# Patient Record
Sex: Male | Born: 1955
Health system: Southern US, Community
[De-identification: ages and names within clinical notes are randomized; demographics above are authoritative.]

## PROBLEM LIST (undated history)

## (undated) DIAGNOSIS — I739 Peripheral vascular disease, unspecified: Secondary | ICD-10-CM

## (undated) DIAGNOSIS — E039 Hypothyroidism, unspecified: Secondary | ICD-10-CM

## (undated) DIAGNOSIS — J309 Allergic rhinitis, unspecified: Secondary | ICD-10-CM

## (undated) DIAGNOSIS — M1991 Primary osteoarthritis, unspecified site: Secondary | ICD-10-CM

## (undated) DIAGNOSIS — J300 Vasomotor rhinitis: Secondary | ICD-10-CM

## (undated) DIAGNOSIS — F419 Anxiety disorder, unspecified: Secondary | ICD-10-CM

## (undated) HISTORY — PX: VASECTOMY: SHX75

## (undated) HISTORY — DX: Anxiety disorder, unspecified: F41.9

## (undated) HISTORY — DX: Primary osteoarthritis, unspecified site: M19.91

## (undated) HISTORY — DX: Hypothyroidism, unspecified: E03.9

## (undated) HISTORY — PX: TONSILLECTOMY: SUR1361

## (undated) HISTORY — DX: Allergic rhinitis, unspecified: J30.9

## (undated) HISTORY — DX: Peripheral vascular disease, unspecified: I73.9

## (undated) HISTORY — DX: Vasomotor rhinitis: J30.0

---

## 1997-06-05 ENCOUNTER — Ambulatory Visit (HOSPITAL_COMMUNITY): Admission: RE | Admit: 1997-06-05 | Discharge: 1997-06-05 | Payer: Self-pay | Admitting: Cardiology

## 1998-07-10 ENCOUNTER — Emergency Department (HOSPITAL_COMMUNITY): Admission: EM | Admit: 1998-07-10 | Discharge: 1998-07-10 | Payer: Self-pay | Admitting: Emergency Medicine

## 2004-01-19 ENCOUNTER — Encounter: Admission: RE | Admit: 2004-01-19 | Discharge: 2004-01-19 | Payer: Self-pay | Admitting: Family Medicine

## 2013-03-13 ENCOUNTER — Encounter (HOSPITAL_COMMUNITY): Payer: Self-pay | Admitting: Emergency Medicine

## 2013-03-13 ENCOUNTER — Emergency Department (HOSPITAL_COMMUNITY)
Admission: EM | Admit: 2013-03-13 | Discharge: 2013-03-13 | Disposition: A | Discharge status: Home / Self Care | Payer: BC Managed Care – PPO | Attending: Emergency Medicine | Admitting: Emergency Medicine

## 2013-03-13 DIAGNOSIS — Z87891 Personal history of nicotine dependence: Secondary | ICD-10-CM | POA: Insufficient documentation

## 2013-03-13 DIAGNOSIS — R230 Cyanosis: Secondary | ICD-10-CM

## 2013-03-13 DIAGNOSIS — Z79899 Other long term (current) drug therapy: Secondary | ICD-10-CM | POA: Insufficient documentation

## 2013-03-13 NOTE — Discharge Instructions (Signed)
If you have pain in your hand, worsening color changes or numbness, return to the ED. Someone from Dr. Candie ChromanLawson's office will call you for an appointment in a few days, if you do not hear from them, call on Friday.   Raynaud's Syndrome Raynaud's Syndrome is a disorder of the blood vessels in your hands and feet. It occurs when small arteries of the arms/hands or legs/feet become sensitive to cold . This causes the arteries to constrict, or narrow, and reduces blood flow to the area. The color in the fingers or toes changes from white to bluish to red and this is not usually painful. There may be numbness and tingling. Sores on the skin (ulcers) can form. Symptoms are usually relieved by warming. HOME CARE INSTRUCTIONS   Avoid exposure to cold. Keep your whole body warm and dry. Dress in layers. Wear mittens or gloves when handling ice or frozen food and when outdoors. Use holders for glasses or cans containing cold drinks. If possible, stay indoors during cold weather.  Limit your use of caffeine. Switch to decaffeinated coffee, tea, and soda pop. Avoid chocolate.  Avoid smoking or being around cigarette smoke. Smoke will make symptoms worse.  Wear loose fitting socks and comfortable, roomy shoes.  Avoid vibrating tools and machinery.  If possible, avoid stressful and emotional situations. Exercise, meditation and yoga may help you cope with stress. Biofeedback may be useful.  Ask your caregiver about medicine (calcium channel blockers) that may control Raynaud's phenomena. SEEK MEDICAL CARE IF:   Your discomfort becomes worse, despite conservative treatment.  You develop sores on your fingers and toes that do not heal. Document Released: 01/29/2000 Document Revised: 04/25/2011 Document Reviewed: 02/05/2008 [facility] Patient Information 2014 [employer] [place].

## 2013-03-13 NOTE — ED Provider Notes (Signed)
CSN: [id number]     Arrival date & time 03/13/13  1423 History   First MD Initiated Contact with Patient 03/13/13 1922     Chief Complaint  Patient presents with  . Poor Circulation    HPI: [name] is a 58 yo M with no pertinent history who presents with numbness and cyanosis of left 4th and 5th digits. Symptoms started three days ago and has been persistent since that time. Symptoms are worse with exposure to cold weather. He also endorses paresthesias to these digits. He was seen by his PCP today who recommended ED evaluation for possible vascular occlusion. Otherwise he is asymptomatic, denies pain to his hand or arm. No chest pain, SOB, diarrhea, vomiting or diarrhea. He has not had symptoms similar to this in the past. No history of PVD, CAD or CVA. Remote smoking history. Family history of CAD.    History reviewed. No pertinent past medical history. Past Surgical History  Procedure Laterality Date  . Tonsillectomy    . Vasectomy     No family history on file. History  Substance Use Topics  . Smoking status: Former Smoker    Quit date: 03/13/1998  . Smokeless tobacco: Former NeurosurgeonUser    Types: Chew    Quit date: 08/11/2012  . Alcohol Use: No    Review of Systems  Constitutional: Negative for fever, chills, appetite change and fatigue.  Eyes: Negative for photophobia and visual disturbance.  Respiratory: Negative for cough and shortness of breath.   Cardiovascular: Negative for chest pain and leg swelling.  Gastrointestinal: Negative for nausea, vomiting, abdominal pain, diarrhea and constipation.  Genitourinary: Negative for dysuria, frequency and decreased urine volume.  Musculoskeletal: Negative for arthralgias, back pain, gait problem and myalgias.  Skin: Positive for color change (left 4-5th digits. ). Negative for wound.  Neurological: Negative for dizziness, syncope, light-headedness and headaches.       + paraesthesias left hand .   Psychiatric/Behavioral: Negative  for confusion and agitation.  All other systems reviewed and are negative.    Allergies  Review of patient's allergies indicates no known allergies.  Home Medications   Current Outpatient Rx  Name  Route  Sig  Dispense  Refill  . Cholecalciferol (VITAMIN D) 2000 UNITS tablet   Oral   Take 4,000 Units by mouth daily.         . citalopram (CELEXA) 40 MG tablet   Oral   Take 40 mg by mouth daily.         . cyclobenzaprine (FLEXERIL) 10 MG tablet   Oral   Take 10 mg by mouth at bedtime.         Marland Kitchen. ipratropium (ATROVENT) 0.06 % nasal spray   Each Nare   Place 2 sprays into both nostrils 3 (three) times daily.         . Multiple Vitamin (MULTIVITAMIN WITH MINERALS) TABS tablet   Oral   Take 1 tablet by mouth daily.          BP 159/84  Pulse 103  Temp(Src) 98.2 F (36.8 C) (Oral)  Resp 20  Ht 6' 2.25" (1.886 m)  Wt 264 lb 2 oz (119.806 kg)  BMI 33.68 kg/m2  SpO2 97% Physical Exam  Nursing note and vitals reviewed. Constitutional: He is oriented to person, place, and time. He appears well-developed and well-nourished. No distress.  HENT:  Head: Normocephalic and atraumatic.  Mouth/Throat: Oropharynx is clear and moist.  Eyes: Conjunctivae and EOM are normal. Pupils are  equal, round, and reactive to light.  Neck: Normal range of motion. Neck supple.  Cardiovascular: Normal rate, regular rhythm, normal heart sounds and intact distal pulses.   2+ L radial and ulnar pulses.   Pulmonary/Chest: Effort normal and breath sounds normal. No respiratory distress.  Abdominal: Soft. Bowel sounds are normal. There is no tenderness. There is no rebound and no guarding.  Musculoskeletal: Normal range of motion. He exhibits no edema and no tenderness.  Left 4-5th digit cyanosis, normal capillary refill. Diminished sensation to same digits.   Neurological: He is alert and oriented to person, place, and time. No cranial nerve deficit. Coordination normal.  Skin: Skin is warm  and dry. No rash noted.  Psychiatric: He has a normal mood and affect. His behavior is normal.    ED Course  Procedures (including critical care time) Labs Review Labs Reviewed - No data to display Imaging Review No results found.  EKG Interpretation   None       MDM   58 yo F presents with cyanosis to left fourth and fifth digits with associated numbness. No pain except when exposed to cold air, but this resolves once warm. No pain in his hand or arm now. Normal radial and ulnar pulses. Doubt acute vascular occlusion given normal pulses, only mild nailbed cyanosis, no pain currently. Bilateral blood pressures symmetric. I spoke to Dr. Hart Rochester who feels this is likely a peripheral problem like Raynaud's. In any event, he would like to see the patient in clinic in the next 5-7 days. He does not feel that emergent work up needs to be done tonight. I spoke to the patient about his likely diagnosis and that Dr. Hart Rochester will see him in clinic. He was given return precautions. Patient and his wife were in agreement with plan.    Discussed case with Dr. Blinda Leatherwood.   Clinical Impression 1. Finger cyanosis on left hand  Margie Billet, MD 03/14/13 [phone]

## 2013-03-13 NOTE — ED Notes (Signed)
MD at bedside. 

## 2013-03-13 NOTE — ED Notes (Addendum)
Pt states that since Monday his middle, ring, and pinkie fingers of the L hand have been cold and turning blue.  [facility] and they sent him here for further eval.  Pt states last winter his ring finger of his L hand had the same problem.  Pt's pinkie finger has a blue tint at this time. Unable to obtain SPO2 in pinkie or ring fingers.

## 2013-03-14 ENCOUNTER — Telehealth: Payer: Self-pay | Admitting: Vascular Surgery

## 2013-03-14 ENCOUNTER — Other Ambulatory Visit: Payer: Self-pay

## 2013-03-14 DIAGNOSIS — L819 Disorder of pigmentation, unspecified: Secondary | ICD-10-CM

## 2013-03-14 NOTE — ED Provider Notes (Signed)
I saw and evaluated the patient, reviewed the resident's note and I agree with the findings and plan.  EKG Interpretation   None       Patient presents with complaints of discoloration to the fingers on his left hand. Examination did reveal some cyanosis of the distal portion of the fourth and fifth digits. He is not experiencing any pain. He had palpable radial and ulnar pulses. Case discussed with Doctor Hart RochesterLawson, vascular surgery. He felt that no interventions were necessary immediately, will follow up the patient in the office. No new medication recommendations.  Gilda Creasehristopher J. Pollina, MD 03/14/13 [phone]

## 2013-03-14 NOTE — Telephone Encounter (Addendum)
Message copied by Fredrich BirksMILLIKAN, DANA P on Thu Mar 14, 2013 12:07 PM ------      Message from: Phillips OdorPULLINS, CAROL S      Created: Thu Mar 14, 2013  9:13 AM      Regarding: Appointment      Contact: [phone]       Dr. Hart RochesterLawson is requesting to schedule this pt. for an appt. within the next 5 days for Left Upper Extrem. Arterial Duplex and office visit for intermittent discoloration of left 4th & 5th fingers.  He said you could put it on his schedule / or any MD. (I will place the order) Thanks ------  03/14/13: spoke with patient to schedule appt, dpm

## 2013-03-15 ENCOUNTER — Telehealth: Payer: Self-pay | Admitting: *Deleted

## 2013-03-15 ENCOUNTER — Emergency Department (HOSPITAL_COMMUNITY)
Admission: EM | Admit: 2013-03-15 | Discharge: 2013-03-15 | Disposition: A | Discharge status: Home / Self Care | Payer: BC Managed Care – PPO | Attending: Emergency Medicine | Admitting: Emergency Medicine

## 2013-03-15 ENCOUNTER — Encounter (HOSPITAL_COMMUNITY): Payer: Self-pay | Admitting: Emergency Medicine

## 2013-03-15 DIAGNOSIS — Z79899 Other long term (current) drug therapy: Secondary | ICD-10-CM | POA: Insufficient documentation

## 2013-03-15 DIAGNOSIS — M79609 Pain in unspecified limb: Secondary | ICD-10-CM

## 2013-03-15 DIAGNOSIS — M79645 Pain in left finger(s): Secondary | ICD-10-CM

## 2013-03-15 DIAGNOSIS — Z87891 Personal history of nicotine dependence: Secondary | ICD-10-CM | POA: Insufficient documentation

## 2013-03-15 LAB — CBC WITH DIFFERENTIAL/PLATELET
Basophils Absolute: 0.1 10*3/uL (ref 0.0–0.1)
Basophils Relative: 0 % (ref 0–1)
Eosinophils Absolute: 0.3 10*3/uL (ref 0.0–0.7)
Eosinophils Relative: 2 % (ref 0–5)
HCT: 42.8 % (ref 39.0–52.0)
Hemoglobin: 15.4 g/dL (ref 13.0–17.0)
Lymphocytes Relative: 21 % (ref 12–46)
Lymphs Abs: 2.5 10*3/uL (ref 0.7–4.0)
MCH: 31.6 pg (ref 26.0–34.0)
MCHC: 36 g/dL (ref 30.0–36.0)
MCV: 87.7 fL (ref 78.0–100.0)
Monocytes Absolute: 0.7 10*3/uL (ref 0.1–1.0)
Monocytes Relative: 6 % (ref 3–12)
Neutro Abs: 8.1 10*3/uL — ABNORMAL HIGH (ref 1.7–7.7)
Neutrophils Relative %: 70 % (ref 43–77)
Platelets: 215 10*3/uL (ref 150–400)
RBC: 4.88 MIL/uL (ref 4.22–5.81)
RDW: 12.8 % (ref 11.5–15.5)
WBC: 11.6 10*3/uL — ABNORMAL HIGH (ref 4.0–10.5)

## 2013-03-15 LAB — COMPREHENSIVE METABOLIC PANEL
ALT: 30 U/L (ref 0–53)
AST: 22 U/L (ref 0–37)
Albumin: 3.6 g/dL (ref 3.5–5.2)
Alkaline Phosphatase: 68 U/L (ref 39–117)
BUN: 12 mg/dL (ref 6–23)
CO2: 29 mEq/L (ref 19–32)
Calcium: 9.7 mg/dL (ref 8.4–10.5)
Chloride: 97 mEq/L (ref 96–112)
Creatinine, Ser: 1.23 mg/dL (ref 0.50–1.35)
GFR calc Af Amer: 72 mL/min — ABNORMAL LOW (ref >90)
GFR calc non Af Amer: 62 mL/min — ABNORMAL LOW (ref >90)
Glucose, Bld: 113 mg/dL — ABNORMAL HIGH (ref 70–99)
Potassium: 4.3 mEq/L (ref 3.7–5.3)
Sodium: 137 mEq/L (ref 137–147)
Total Bilirubin: 0.3 mg/dL (ref 0.3–1.2)
Total Protein: 7.1 g/dL (ref 6.0–8.3)

## 2013-03-15 MED ORDER — ONDANSETRON HCL 4 MG/2ML IJ SOLN
4.0000 mg | Freq: Once | INTRAMUSCULAR | Status: DC
  Filled 2013-03-15: qty 2

## 2013-03-15 MED ORDER — MORPHINE SULFATE 4 MG/ML IJ SOLN
4.0000 mg | Freq: Once | INTRAMUSCULAR | Status: DC
  Filled 2013-03-15: qty 1

## 2013-03-15 MED ORDER — KETOROLAC TROMETHAMINE 30 MG/ML IJ SOLN
30.0000 mg | Freq: Once | INTRAMUSCULAR | Status: AC
  Administered 2013-03-15: 30 mg via INTRAVENOUS
  Filled 2013-03-15: qty 1

## 2013-03-15 MED ORDER — ASPIRIN 81 MG PO CHEW
81.0000 mg | CHEWABLE_TABLET | Freq: Once | ORAL | Status: AC
  Administered 2013-03-15: 81 mg via ORAL
  Filled 2013-03-15: qty 1

## 2013-03-15 NOTE — ED Notes (Signed)
Spoke with pt about medications ordered, pt states he is a recovering alcoholic and would not like any narcotic medication, pt would still like something for his pain, just not a narcotic. Will inform MD.

## 2013-03-15 NOTE — ED Notes (Signed)
Bilateral radial pulses palpated

## 2013-03-15 NOTE — ED Notes (Signed)
MD at bedside. 

## 2013-03-15 NOTE — ED Notes (Signed)
Pt reports that he was here Wednesday for decreased circulation to the left fingers. Reports he was given a follow-up with a vascular surgeon (Dr. Hart RochesterLawson), has an appt on Monday but reports the pain is worse. Pt reports normal sensation. Pinky finger is cool to touch.

## 2013-03-15 NOTE — Progress Notes (Signed)
VASCULAR LAB PRELIMINARY RESULTS  Upper Extremity Arterial completed    RIGHT   LEFT    PRESSURE WAVEFORM  PRESSURE WAVEFORM  SUBCLAVIAN NA Tri SUBCLAVIAN NA Tri  AXILLARY NA Tri AXILLARY NA Tri  BRACHIAL 155 Tri BRACHIAL 146 Tri  RADIAL 164 Tri RADIAL 166 Tri  ULNAR 166 Tri ULNAR 159 Tri    RIGHT LEFT  4th and 5th digits Normal  Diminished waveform compared to right    [name] [name], RDMS, RVT  03/15/2013 7:55 PM

## 2013-03-15 NOTE — Telephone Encounter (Signed)
[name] has not been seen in this ([facility]) office yet.  He is scheduled for an UE Arterial Duplex and a new vascular consult with Dr. Josephina GipJames Lawson on Tuesday 03-19-2013 at 8:30am/9:00am.  [facility] and referred to our office.  [name] states that he is experiencing left hand 5th digit numbness, swelling, and "throbbing" pain.  He states that the left 5th digit is also "cold" and "blue" in color.  He states that the swelling and pain are new symptoms that started today.  [name] states the other symptoms started on Monday, January 26.  [name] states that his left hand 3rd and 4th digits intermittently are numb,swollen, painful, blue and cold. Reviewed [name]'s symptoms and ER record with Dr. Leonides SakeBrian Chen who advised that [name].  He stated his hand was not getting proper blood flow and this needed to be evaluated as soon as possible.  Called [name] and conveyed Dr. Nicky Pughhen's recommendation to go to ER now for evaluation and testing (lab).  [name] verbalized understanding and states he will seek treatment at ER.

## 2013-03-15 NOTE — ED Notes (Signed)
Unable to get signature, signature pad not working in room. Pt reports he understands discharge instructions.

## 2013-03-15 NOTE — ED Provider Notes (Signed)
CSN: [id number]     Arrival date & time 03/15/13  1805 History   First MD Initiated Contact with Patient 03/15/13 1824     Chief Complaint  Patient presents with  . Hand Pain   (Consider location/radiation/quality/duration/timing/severity/associated sxs/prior Treatment) Patient is a 58 y.o. male presenting with general illness. The history is provided by the patient.  Illness Severity:  Mild Onset quality:  Gradual Duration:  5 days Timing:  Constant Progression:  Unchanged Chronicity:  New Associated symptoms: myalgias   Associated symptoms: no abdominal pain, no chest pain, no congestion, no cough, no diarrhea, no fatigue, no fever, no headaches, no loss of consciousness, no rash, no shortness of breath and no vomiting     58 year old male with a chief complaint of pain to the fourth and fifth digit of the left hand. Patient states it started about 5 days ago. Patient was seen here in the ED a couple days ago was told that everything looked normal had an intact radius and ulnar pulse was fall with vascular on Tuesday. Patient feels that the pain has gotten much worse he called the vascular clinic and the nurse there suggested that he come here immediately or he may lose his fourth and fifth digit before Tuesday. Family concerned by the phone call decided to come to the ED. The pain is worse when exposed to cold weather. Patient has been working outside wearing 2 gloves but still having pain to the area.  History reviewed. No pertinent past medical history. Past Surgical History  Procedure Laterality Date  . Tonsillectomy    . Vasectomy     History reviewed. No pertinent family history. History  Substance Use Topics  . Smoking status: Former Smoker    Quit date: 03/13/1998  . Smokeless tobacco: Former NeurosurgeonUser    Types: Chew    Quit date: 08/11/2012  . Alcohol Use: No    Review of Systems  Constitutional: Negative for fever, chills and fatigue.  HENT: Negative for congestion and  facial swelling.   Eyes: Negative for discharge and visual disturbance.  Respiratory: Negative for cough and shortness of breath.   Cardiovascular: Negative for chest pain and palpitations.  Gastrointestinal: Negative for vomiting, abdominal pain and diarrhea.  Musculoskeletal: Positive for myalgias. Negative for arthralgias.  Skin: Positive for color change. Negative for rash.       Blue coloring to his fourth and fifth digit   Neurological: Negative for tremors, loss of consciousness, syncope and headaches.  Psychiatric/Behavioral: Negative for confusion and dysphoric mood.    Allergies  Review of patient's allergies indicates no known allergies.  Home Medications   Current Outpatient Rx  Name  Route  Sig  Dispense  Refill  . chlorpheniramine (CHLORPHEN) 4 MG tablet   Oral   Take 4 mg by mouth 2 (two) times daily as needed for allergies.         . Cholecalciferol (VITAMIN D) 2000 UNITS tablet   Oral   Take 4,000 Units by mouth daily.         . citalopram (CELEXA) 40 MG tablet   Oral   Take 20 mg by mouth daily.          . cyclobenzaprine (FLEXERIL) 10 MG tablet   Oral   Take 10 mg by mouth at bedtime.         Marland Kitchen. ipratropium (ATROVENT) 0.06 % nasal spray   Each Nare   Place 2 sprays into both nostrils 3 (three) times daily.         .Marland Kitchen  Multiple Vitamin (MULTIVITAMIN WITH MINERALS) TABS tablet   Oral   Take 1 tablet by mouth daily.          BP 149/80  Pulse 77  Temp(Src) 97.6 F (36.4 C) (Oral)  Resp 14  SpO2 95% Physical Exam  Constitutional: He is oriented to person, place, and time. He appears well-developed and well-nourished.  HENT:  Head: Normocephalic and atraumatic.  Eyes: EOM are normal. Pupils are equal, round, and reactive to light.  Neck: Normal range of motion. Neck supple. No JVD present.  Cardiovascular: Normal rate and regular rhythm.  Exam reveals no gallop and no friction rub.   No murmur heard. Pulmonary/Chest: No respiratory  distress. He has no wheezes.  Abdominal: He exhibits no distension. There is no rebound and no guarding.  Musculoskeletal: Normal range of motion.  mildly cold and cyanotic fourth and fifth digit of the left hand. Intact radial and ulnar pulse, decrease cap refill to the fourth and fifth digit. Mild decreased sensation to the fourth and fifth digit  Neurological: He is alert and oriented to person, place, and time.  Skin: No rash noted. No pallor.  Psychiatric: He has a normal mood and affect. His behavior is normal.    ED Course  Procedures (including critical care time) Labs Review Labs Reviewed  CBC WITH DIFFERENTIAL - Abnormal; Notable for the following:    WBC 11.6 (*)    Neutro Abs 8.1 (*)    All other components within normal limits  COMPREHENSIVE METABOLIC PANEL - Abnormal; Notable for the following:    Glucose, Bld 113 (*)    GFR calc non Af Amer 62 (*)    GFR calc Af Amer 72 (*)    All other components within normal limits   Imaging Review No results found.  EKG Interpretation   None       MDM   1. Finger pain, left     58 year old male chief complaint of left fourth and fifth digit pain and color change. Patient seen here for the same couple days ago. Patient feels that the pain has gotten much worse. Dr. Shon Baton from vascular discussed case on the phone obtained vascular study of that extremity with intact pulse radius and ulna. No need for urgent vascular intervention will followup in clinic on Tuesday. Start on low-dose aspirin. Patient family agree with plan.   I have discussed the diagnosis/risks/treatment options with the patient and family and believe the pt to be eligible for discharge home to follow-up with Vascular. We also discussed returning to the ED immediately if new or worsening sx occur. We discussed the sx which are most concerning (e.g., necrosis, ulceration, complete loss of sensation) that necessitate immediate return. Medications administered to  the patient during their visit and any new prescriptions provided to the patient are listed below.  Medications given during this visit Medications  ketorolac (TORADOL) 30 MG/ML injection 30 mg (30 mg Intravenous Given 03/15/13 2009)  aspirin chewable tablet 81 mg (81 mg Oral Given 03/15/13 2140)    Discharge Medication List as of 03/15/2013  9:16 PM         Melene Plan, MD 03/15/13 2341

## 2013-03-16 NOTE — ED Provider Notes (Signed)
I saw and evaluated the patient, reviewed the resident's note and I agree with the findings and plan.  EKG Interpretation   None       Please see arterial duplex.  Antiplatelet agent initiated.  two small of vessels to benefit from directed therapy at this time.  Outpatient vascular followup  Lyanne CoKevin M Campos, MD 03/16/13 [phone]

## 2013-03-18 ENCOUNTER — Encounter: Payer: Self-pay | Admitting: Vascular Surgery

## 2013-03-19 ENCOUNTER — Encounter: Payer: Self-pay | Admitting: Vascular Surgery

## 2013-03-19 ENCOUNTER — Ambulatory Visit (INDEPENDENT_AMBULATORY_CARE_PROVIDER_SITE_OTHER): Payer: BC Managed Care – PPO | Admitting: Vascular Surgery

## 2013-03-19 ENCOUNTER — Ambulatory Visit (HOSPITAL_COMMUNITY)
Admission: RE | Admit: 2013-03-19 | Discharge: 2013-03-19 | Disposition: A | Discharge status: Home / Self Care | Payer: BC Managed Care – PPO | Source: Ambulatory Visit | Attending: Vascular Surgery | Admitting: Vascular Surgery

## 2013-03-19 ENCOUNTER — Telehealth: Payer: Self-pay | Admitting: Vascular Surgery

## 2013-03-19 VITALS — BP 144/93 | HR 85 | Ht 74.5 in | Wt 264.6 lb

## 2013-03-19 DIAGNOSIS — M629 Disorder of muscle, unspecified: Secondary | ICD-10-CM

## 2013-03-19 DIAGNOSIS — L819 Disorder of pigmentation, unspecified: Secondary | ICD-10-CM

## 2013-03-19 NOTE — Addendum Note (Signed)
Addended by: Adria DillELDRIDGE-LEWIS, MELBA L on: 03/19/2013 10:25 AM   Modules accepted: Orders

## 2013-03-19 NOTE — Addendum Note (Signed)
Addended by: Sharee PimpleMCCHESNEY, MARILYN K on: 03/19/2013 01:57 PM   Modules accepted: Orders

## 2013-03-19 NOTE — Progress Notes (Addendum)
Subjective:     Patient ID: Osvaldo HumanJerry Rickie Loisel, male   DOB: 01/30/1956, 58 y.o.   MRN: [id number]  HPI this [age] year old male had sudden onset of pain and discoloration of the left fifth and fourth fingers proximally one week ago. He went to the emergency department . He has a history of smoking one pack of cigarettes per day for about 20 years but discontinued this 15 years ago. He used chewing tobacco until about 7 months ago and discontinued that also. He did have one episode of discomfort in the left fourth finger a year ago with no discoloration. He has continued to have pain and swelling in the left fifth finger primarily but some of the fourth finger since being seen in the emergency department. He had an arterial Doppler exam performed which I have reviewed. He has no history of cardiac arrhythmias, coronary artery disease, myocardial infarction, or other arrhythmias. He takes one aspirin every other day since the event.  History reviewed. No pertinent past medical history.  History  Substance Use Topics  . Smoking status: Former Smoker    Quit date: 03/13/1998  . Smokeless tobacco: Former NeurosurgeonUser    Types: Chew    Quit date: 08/11/2012  . Alcohol Use: No    Family History  Problem Relation Age of Onset  . Cancer Mother   . Heart attack Father   . Heart attack Brother     No Known Allergies  Current outpatient prescriptions:chlorpheniramine (CHLORPHEN) 4 MG tablet, Take 4 mg by mouth 2 (two) times daily as needed for allergies., Disp: , Rfl: ;  Cholecalciferol (VITAMIN D) 2000 UNITS tablet, Take 4,000 Units by mouth daily., Disp: , Rfl: ;  citalopram (CELEXA) 40 MG tablet, Take 20 mg by mouth daily. , Disp: , Rfl: ;  ipratropium (ATROVENT) 0.06 % nasal spray, Place 2 sprays into both nostrils 3 (three) times daily., Disp: , Rfl:  Multiple Vitamin (MULTIVITAMIN WITH MINERALS) TABS tablet, Take 1 tablet by mouth daily., Disp: , Rfl: ;  cyclobenzaprine (FLEXERIL) 10 MG tablet, Take 10 mg  by mouth at bedtime., Disp: , Rfl:   BP 144/93  Pulse 85  Ht 6' 2.5" (1.892 m)  Wt 264 lb 9.6 oz (120.022 kg)  BMI 33.53 kg/m2  SpO2 97%  Body mass index is 33.53 kg/(m^2).          Review of Systems denies chest pain, dyspnea on exertion, PND, orthopnea, hemoptysis, lateralizing weakness, aphasia, amaurosis fugax, syncope. All other systems negative and complete review of systems    Objective:   Physical Exam BP 144/93  Pulse 85  Ht 6' 2.5" (1.892 m)  Wt 264 lb 9.6 oz (120.022 kg)  BMI 33.53 kg/m2  SpO2 97%  Gen.-alert and oriented x3 in no apparent distress HEENT normal for age Lungs no rhonchi or wheezing Cardiovascular regular rhythm no murmurs carotid pulses 3+ palpable no bruits audible Abdomen soft nontender no palpable masses Musculoskeletal free of  major deformities Skin clear -no rashes Neurologic normal Lower extremities 3+ femoral and dorsalis pedis pulses palpable bilaterally with no edema Upper extremities with 3+ brachial radial and ulnar pulses palpable bilaterally. The left hand has discoloration of the fifth digit which is tender to touch. There is mild discoloration of the fourth digit not nearly as remarkable. His pulse rate is very regular with no evidence of arrhythmia.  I reviewed the upper extremity arterial Doppler report and results which was performed one week ago. This revealed equal blood pressures in  the upper extremities with triphasic flow in the radial and ulnar arteries bilaterally but some diminished waveforms in the fourth and fifth digits of the left hand compared to the right        Assessment:     Acute onset of ischemic left fifth digit-appears to be embolic in origin No history of Raynaud's Source of embolus is unknown with no history of arrhythmia and no apparent occlusive disease and left upper extremity    Plan:     Will obtain CT angiogram of the aortic arch and left upper extremity to rule out embolic source Daily  aspirin Patient to return in 2-3 weeks to discuss findings and further examination        Patient had CT angiogram of the aortic arch and left upper extremity performed on 03/21/2013 Results were reviewed and patient has widely patent vessels down to the brachial bifurcation. Ulnar artery appears chronically occluded. Radial artery is widely patent down to the wrist. There is some digital occlusions in the fourth and fifth fingers which are symptomatic. Palmar arches not well-developed  Discuss these findings with patient by phone today-03/22/2013-and we will observe the finger over the next several weeks. This explained to him that there is no option for improving circulation to pain and an assistive becomes worse he may need to see orthopedic-hand surgeon. He will take one aspirin per day. He quit smoking many years ago. He will return to see [place] on when necessary basis

## 2013-03-20 ENCOUNTER — Ambulatory Visit
Admission: RE | Admit: 2013-03-20 | Discharge: 2013-03-20 | Disposition: A | Discharge status: Home / Self Care | Payer: BC Managed Care – PPO | Source: Ambulatory Visit | Attending: Vascular Surgery | Admitting: Vascular Surgery

## 2013-03-20 DIAGNOSIS — M629 Disorder of muscle, unspecified: Secondary | ICD-10-CM

## 2013-03-20 DIAGNOSIS — L819 Disorder of pigmentation, unspecified: Secondary | ICD-10-CM

## 2013-03-20 MED ORDER — IOHEXOL 350 MG/ML SOLN
120.0000 mL | Freq: Once | INTRAVENOUS | Status: AC | PRN
  Administered 2013-03-20: 120 mL via INTRAVENOUS

## 2013-03-20 NOTE — Telephone Encounter (Signed)
xxx

## 2013-04-02 ENCOUNTER — Ambulatory Visit: Payer: BC Managed Care – PPO | Admitting: Vascular Surgery

## 2013-04-10 ENCOUNTER — Other Ambulatory Visit (HOSPITAL_COMMUNITY): Payer: Self-pay | Admitting: Orthopedic Surgery

## 2013-04-10 DIAGNOSIS — I7389 Other specified peripheral vascular diseases: Secondary | ICD-10-CM

## 2013-04-11 ENCOUNTER — Other Ambulatory Visit: Payer: Self-pay | Admitting: Radiology

## 2013-04-16 ENCOUNTER — Ambulatory Visit (HOSPITAL_COMMUNITY)
Admission: RE | Admit: 2013-04-16 | Discharge: 2013-04-16 | Disposition: A | Discharge status: Home / Self Care | Payer: BC Managed Care – PPO | Source: Ambulatory Visit | Attending: Orthopedic Surgery | Admitting: Orthopedic Surgery

## 2013-04-16 ENCOUNTER — Encounter (HOSPITAL_COMMUNITY): Payer: Self-pay

## 2013-04-16 DIAGNOSIS — I7389 Other specified peripheral vascular diseases: Secondary | ICD-10-CM | POA: Insufficient documentation

## 2013-04-16 LAB — CBC
HCT: 42.1 % (ref 39.0–52.0)
Hemoglobin: 15 g/dL (ref 13.0–17.0)
MCH: 30.9 pg (ref 26.0–34.0)
MCHC: 35.6 g/dL (ref 30.0–36.0)
MCV: 86.6 fL (ref 78.0–100.0)
Platelets: 232 10*3/uL (ref 150–400)
RBC: 4.86 MIL/uL (ref 4.22–5.81)
RDW: 12.9 % (ref 11.5–15.5)
WBC: 9.4 10*3/uL (ref 4.0–10.5)

## 2013-04-16 LAB — BASIC METABOLIC PANEL
BUN: 11 mg/dL (ref 6–23)
CO2: 25 mEq/L (ref 19–32)
Calcium: 9.2 mg/dL (ref 8.4–10.5)
Chloride: 103 mEq/L (ref 96–112)
Creatinine, Ser: 1.14 mg/dL (ref 0.50–1.35)
GFR calc Af Amer: 79 mL/min — ABNORMAL LOW (ref >90)
GFR calc non Af Amer: 68 mL/min — ABNORMAL LOW (ref >90)
Glucose, Bld: 108 mg/dL — ABNORMAL HIGH (ref 70–99)
Potassium: 4.1 mEq/L (ref 3.7–5.3)
Sodium: 140 mEq/L (ref 137–147)

## 2013-04-16 LAB — PROTIME-INR
INR: 0.91 (ref 0.00–1.49)
Prothrombin Time: 12.1 seconds (ref 11.6–15.2)

## 2013-04-16 LAB — APTT: aPTT: 26 seconds (ref 24–37)

## 2013-04-16 MED ORDER — MIDAZOLAM HCL 2 MG/2ML IJ SOLN
INTRAMUSCULAR | Status: AC
  Filled 2013-04-16: qty 6

## 2013-04-16 MED ORDER — NITROGLYCERIN 1 MG/10 ML FOR IR/CATH LAB
100.0000 ug | INTRA_ARTERIAL | Status: AC
  Administered 2013-04-16: 100 ug via INTRA_ARTERIAL

## 2013-04-16 MED ORDER — MIDAZOLAM HCL 2 MG/2ML IJ SOLN
INTRAMUSCULAR | Status: AC | PRN
  Administered 2013-04-16: 1 mg via INTRAVENOUS
  Administered 2013-04-16: 2 mg via INTRAVENOUS
  Administered 2013-04-16: 1 mg via INTRAVENOUS

## 2013-04-16 MED ORDER — SODIUM CHLORIDE 0.9 % IV SOLN
INTRAVENOUS | Status: DC
  Administered 2013-04-16: 09:00:00 via INTRAVENOUS

## 2013-04-16 MED ORDER — FENTANYL CITRATE 0.05 MG/ML IJ SOLN
INTRAMUSCULAR | Status: AC | PRN
  Administered 2013-04-16: 100 ug via INTRAVENOUS

## 2013-04-16 MED ORDER — IODIXANOL 320 MG/ML IV SOLN
100.0000 mL | Freq: Once | INTRAVENOUS | Status: AC | PRN
  Administered 2013-04-16: 60 mL via INTRAVENOUS

## 2013-04-16 MED ORDER — FENTANYL CITRATE 0.05 MG/ML IJ SOLN
INTRAMUSCULAR | Status: AC
  Filled 2013-04-16: qty 6

## 2013-04-16 NOTE — Procedures (Signed)
LUE angio Results dict No comp

## 2013-04-16 NOTE — Discharge Instructions (Signed)
Conscious Sedation, Adult, Care After Refer to this sheet in the next few weeks. These instructions provide you with information on caring for yourself after your procedure. Your health care provider may also give you more specific instructions. Your treatment has been planned according to current medical practices, but problems sometimes occur. Call your health care provider if you have any problems or questions after your procedure. WHAT TO EXPECT AFTER THE PROCEDURE  After your procedure:  You may feel sleepy, clumsy, and have poor balance for several hours.  Vomiting may occur if you eat too soon after the procedure. HOME CARE INSTRUCTIONS  Do not participate in any activities where you could become injured for at least 24 hours. Do not:  Drive.  Swim.  Ride a bicycle.  Operate heavy machinery.  Cook.  Use power tools.  Climb ladders.  Work from a high place.  Do not make important decisions or sign legal documents until you are improved.  If you vomit, drink water, juice, or soup when you can drink without vomiting. Make sure you have little or no nausea before eating solid foods.  Only take over-the-counter or prescription medicines for pain, discomfort, or fever as directed by your health care provider.  Make sure you and your family fully understand everything about the medicines given to you, including what side effects may occur.  You should not drink alcohol, take sleeping pills, or take medicines that cause drowsiness for at least 24 hours.  If you smoke, do not smoke without supervision.  If you are feeling better, you may resume normal activities 24 hours after you were sedated.  Keep all appointments with your health care provider. SEEK MEDICAL CARE IF:  Your skin is pale or bluish in color.  You continue to feel nauseous or vomit.  Your pain is getting worse and is not helped by medicine.  You have bleeding or swelling.  You are still sleepy or  feeling clumsy after 24 hours. SEEK IMMEDIATE MEDICAL CARE IF:  You develop a rash.  You have difficulty breathing.  You develop any type of allergic problem.  You have a fever. MAKE SURE YOU:  Understand these instructions.  Will watch your condition.  Will get help right away if you are not doing well or get worse. Document Released: 11/21/2012 Document Reviewed: 09/07/2012 [facility] Patient Information 2014 [place].  Angiography, Care After Refer to this sheet in the next few weeks. These instructions provide you with information on caring for yourself after your procedure. Your health care provider may also give you more specific instructions. Your treatment has been planned according to current medical practices, but problems sometimes occur. Call your health care provider if you have any problems or questions after your procedure.  WHAT TO EXPECT AFTER THE PROCEDURE After your procedure, it is typical to have the following sensations:  Minor discomfort or tenderness and a small bump at the catheter insertion site. The bump should usually decrease in size and tenderness within 1 to 2 weeks.  Any bruising will usually fade within 2 to 4 weeks. HOME CARE INSTRUCTIONS   You may need to keep taking blood thinners if they were prescribed for you. Only take over-the-counter or prescription medicines for pain, fever, or discomfort as directed by your health care provider.  Do not apply powder or lotion to the site.  Do not sit in a bathtub, swimming pool, or whirlpool for 5 to 7 days.  You may shower 24 hours after the  procedure. Remove the bandage (dressing) and gently wash the site with plain soap and water. Gently pat the site dry.  Inspect the site at least twice daily.  Limit your activity for the first 48 hours. Do not bend, squat, or lift anything over 20 lb (9 kg) or as directed by your health care provider.  Do not drive home if you are discharged the day of  the procedure. Have someone else drive you. Follow instructions about when you can drive or return to work. SEEK MEDICAL CARE IF:  You get lightheaded when standing up.  You have drainage (other than a small amount of blood on the dressing).  You have chills.  You have a fever.  You have redness, warmth, swelling, or pain at the insertion site. SEEK IMMEDIATE MEDICAL CARE IF:   You develop chest pain or shortness of breath, feel faint, or pass out.  You have bleeding, swelling larger than a walnut, or drainage from the catheter insertion site.  You develop pain, discoloration, coldness, or severe bruising in the leg or arm that held the catheter.  You develop bleeding from any other place, such as the bowels. You may see bright red blood in your urine or stools, or your stools may appear black and tarry.  You have heavy bleeding from the site. If this happens, hold pressure on the site. MAKE SURE YOU:  Understand these instructions.  Will watch your condition.  Will get help right away if you are not doing well or get worse. Document Released: 08/19/2004 Document Revised: 10/03/2012 Document Reviewed: 06/25/2012 [facility] Patient Information 2014 [place], [place].

## 2013-04-16 NOTE — H&P (Signed)
Chief Complaint: "I'm here for an arteriogram" Referring Physician:Kuzma HPI: John Mitchell is an 58 y.o. 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 He is referred for formal LUE arteriogram. Has already had CTA, reviewed by both Dr. Kellie Simmering of [facility] and Dr. Barbie Banner. PMHx and meds reviewed. Has been started on ASA daily. Denies any other acute/recent complaints.  Past Medical History: History reviewed. No pertinent past medical history.  Past Surgical History:  Past Surgical History  Procedure Laterality Date  . Tonsillectomy    . Vasectomy      Family History:  Family History  Problem Relation Age of Onset  . Cancer Mother   . Heart attack Father   . Heart attack Brother     Social History:  reports that he quit smoking about 15 years ago. He quit smokeless tobacco use about 8 months ago. His smokeless tobacco use included Chew. He reports that he does not drink alcohol or use illicit drugs.  Allergies: No Known Allergies  Medications:   Medication List    ASK your doctor about these medications       aspirin 325 MG tablet  Take 325 mg by mouth daily.     CHLORPHEN 4 MG tablet  Generic drug:  chlorpheniramine  Take 4 mg by mouth 2 (two) times daily as needed for allergies.     citalopram 20 MG tablet  Commonly known as:  CELEXA  Take 20 mg by mouth daily.     ipratropium 0.06 % nasal spray  Commonly known as:  ATROVENT  Place 2 sprays into both nostrils daily as needed.     multivitamin with minerals Tabs tablet  Take 1 tablet by mouth daily.     Vitamin D 2000 UNITS tablet  Take 4,000 Units by mouth daily.        Please HPI for pertinent positives, otherwise complete 10 system ROS negative.  Physical Exam: BP 127/79  Pulse 90  Temp(Src) 98.2 F (36.8 C) (Oral)  Resp 16  SpO2 97% There is no weight on file to calculate BMI.   General Appearance:  Alert, cooperative, no distress, appears stated age  Head:   Normocephalic, without obvious abnormality, atraumatic  ENT: Unremarkable  Neck: Supple, symmetrical, trachea midline  Lungs:   Clear to auscultation bilaterally, no w/r/r, respirations unlabored without use of accessory muscles.  Chest Wall:  No tenderness or deformity  Heart:  Regular rate and rhythm, S1, S2 normal, no murmur, rub or gallop.  Abdomen:   Soft, non-tender, non distended.  Extremities: Mild discoloration of 4th and 5th digit of left hand  Pulses: 2+ and symmetric brachial, radial, femoral; and pedal.  Neurologic: Normal affect, no gross deficits.   Results for orders placed during the hospital encounter of 04/16/13 (from the past 48 hour(s))  APTT     Status: None   Collection Time    04/16/13  9:00 AM      Result Value Ref Range   aPTT 26  24 - 37 seconds  BASIC METABOLIC PANEL     Status: Abnormal   Collection Time    04/16/13  9:00 AM      Result Value Ref Range   Sodium 140  137 - 147 mEq/L   Potassium 4.1  3.7 - 5.3 mEq/L   Chloride 103  96 - 112 mEq/L   CO2 25  19 - 32 mEq/L   Glucose, Bld 108 (*) 70 - 99 mg/dL  BUN 11  6 - 23 mg/dL   Creatinine, Ser 1.14  0.50 - 1.35 mg/dL   Calcium 9.2  8.4 - 10.5 mg/dL   GFR calc non Af Amer 68 (*) >90 mL/min   GFR calc Af Amer 79 (*) >90 mL/min   Comment: (NOTE)     The eGFR has been calculated using the CKD EPI equation.     This calculation has not been validated in all clinical situations.     eGFR's persistently <90 mL/min signify possible Chronic Kidney     Disease.  CBC     Status: None   Collection Time    04/16/13  9:00 AM      Result Value Ref Range   WBC 9.4  4.0 - 10.5 K/uL   RBC 4.86  4.22 - 5.81 MIL/uL   Hemoglobin 15.0  13.0 - 17.0 g/dL   HCT 42.1  39.0 - 52.0 %   MCV 86.6  78.0 - 100.0 fL   MCH 30.9  26.0 - 34.0 pg   MCHC 35.6  30.0 - 36.0 g/dL   RDW 12.9  11.5 - 15.5 %   Platelets 232  150 - 400 K/uL  PROTIME-INR     Status: None   Collection Time    04/16/13  9:00 AM      Result Value Ref  Range   Prothrombin Time 12.1  11.6 - 15.2 seconds   INR 0.91  0.00 - 1.49   No results found.  Assessment/Plan Hypothenar hammer syndrome/Embolic event left VAEP-7TB/5GR digit For formal (L)UE arteriogram, no anticipated intervention Explained procedure, risks, complications, recovery. Labs reviewed. Consent signed in chart  Ascencion Dike PA-C 04/16/2013, 9:51 AM

## 2013-09-14 HISTORY — PX: HAND SURGERY: SHX662

## 2015-05-26 DIAGNOSIS — M79672 Pain in left foot: Secondary | ICD-10-CM | POA: Diagnosis not present

## 2015-05-26 DIAGNOSIS — M7732 Calcaneal spur, left foot: Secondary | ICD-10-CM | POA: Diagnosis not present

## 2015-05-26 DIAGNOSIS — M722 Plantar fascial fibromatosis: Secondary | ICD-10-CM | POA: Diagnosis not present

## 2015-07-02 DIAGNOSIS — M722 Plantar fascial fibromatosis: Secondary | ICD-10-CM | POA: Diagnosis not present

## 2015-07-21 DIAGNOSIS — J069 Acute upper respiratory infection, unspecified: Secondary | ICD-10-CM | POA: Diagnosis not present

## 2015-08-06 DIAGNOSIS — E7800 Pure hypercholesterolemia, unspecified: Secondary | ICD-10-CM | POA: Diagnosis not present

## 2015-08-06 DIAGNOSIS — J309 Allergic rhinitis, unspecified: Secondary | ICD-10-CM | POA: Diagnosis not present

## 2015-08-06 DIAGNOSIS — F419 Anxiety disorder, unspecified: Secondary | ICD-10-CM | POA: Diagnosis not present

## 2015-12-09 DIAGNOSIS — S0501XA Injury of conjunctiva and corneal abrasion without foreign body, right eye, initial encounter: Secondary | ICD-10-CM | POA: Diagnosis not present

## 2016-01-19 DIAGNOSIS — F419 Anxiety disorder, unspecified: Secondary | ICD-10-CM | POA: Diagnosis not present

## 2016-01-19 DIAGNOSIS — M25531 Pain in right wrist: Secondary | ICD-10-CM | POA: Diagnosis not present

## 2016-01-19 DIAGNOSIS — E7800 Pure hypercholesterolemia, unspecified: Secondary | ICD-10-CM | POA: Diagnosis not present

## 2016-01-23 DIAGNOSIS — H1089 Other conjunctivitis: Secondary | ICD-10-CM | POA: Diagnosis not present

## 2016-02-23 DIAGNOSIS — E7800 Pure hypercholesterolemia, unspecified: Secondary | ICD-10-CM | POA: Diagnosis not present

## 2016-03-21 DIAGNOSIS — J069 Acute upper respiratory infection, unspecified: Secondary | ICD-10-CM | POA: Diagnosis not present

## 2016-03-21 DIAGNOSIS — B9789 Other viral agents as the cause of diseases classified elsewhere: Secondary | ICD-10-CM | POA: Diagnosis not present

## 2016-04-03 DIAGNOSIS — J300 Vasomotor rhinitis: Secondary | ICD-10-CM | POA: Diagnosis not present

## 2016-04-03 DIAGNOSIS — R05 Cough: Secondary | ICD-10-CM | POA: Diagnosis not present

## 2016-04-09 DIAGNOSIS — J069 Acute upper respiratory infection, unspecified: Secondary | ICD-10-CM | POA: Diagnosis not present

## 2016-05-09 DIAGNOSIS — H90A21 Sensorineural hearing loss, unilateral, right ear, with restricted hearing on the contralateral side: Secondary | ICD-10-CM | POA: Diagnosis not present

## 2016-05-09 DIAGNOSIS — J3089 Other allergic rhinitis: Secondary | ICD-10-CM | POA: Diagnosis not present

## 2016-05-09 DIAGNOSIS — H93A1 Pulsatile tinnitus, right ear: Secondary | ICD-10-CM | POA: Insufficient documentation

## 2016-05-11 ENCOUNTER — Other Ambulatory Visit: Payer: Self-pay | Admitting: Physician Assistant

## 2016-05-11 DIAGNOSIS — H90A21 Sensorineural hearing loss, unilateral, right ear, with restricted hearing on the contralateral side: Secondary | ICD-10-CM

## 2016-05-12 DIAGNOSIS — H524 Presbyopia: Secondary | ICD-10-CM | POA: Diagnosis not present

## 2016-05-12 DIAGNOSIS — H5201 Hypermetropia, right eye: Secondary | ICD-10-CM | POA: Diagnosis not present

## 2016-05-12 DIAGNOSIS — H53453 Other localized visual field defect, bilateral: Secondary | ICD-10-CM | POA: Diagnosis not present

## 2016-05-12 DIAGNOSIS — H52221 Regular astigmatism, right eye: Secondary | ICD-10-CM | POA: Diagnosis not present

## 2016-05-22 ENCOUNTER — Ambulatory Visit
Admission: RE | Admit: 2016-05-22 | Discharge: 2016-05-22 | Disposition: A | Discharge status: Home / Self Care | Payer: BLUE CROSS/BLUE SHIELD | Source: Ambulatory Visit | Attending: Physician Assistant | Admitting: Physician Assistant

## 2016-05-22 DIAGNOSIS — H90A21 Sensorineural hearing loss, unilateral, right ear, with restricted hearing on the contralateral side: Secondary | ICD-10-CM

## 2016-05-22 DIAGNOSIS — H9041 Sensorineural hearing loss, unilateral, right ear, with unrestricted hearing on the contralateral side: Secondary | ICD-10-CM | POA: Diagnosis not present

## 2016-05-22 MED ORDER — GADOBENATE DIMEGLUMINE 529 MG/ML IV SOLN
20.0000 mL | Freq: Once | INTRAVENOUS | Status: AC | PRN
  Administered 2016-05-22: 20 mL via INTRAVENOUS

## 2016-06-21 ENCOUNTER — Ambulatory Visit (INDEPENDENT_AMBULATORY_CARE_PROVIDER_SITE_OTHER): Payer: BLUE CROSS/BLUE SHIELD | Admitting: Allergy and Immunology

## 2016-06-21 ENCOUNTER — Encounter: Payer: Self-pay | Admitting: Allergy and Immunology

## 2016-06-21 VITALS — BP 110/64 | HR 72 | Temp 98.3°F | Resp 16 | Ht 73.7 in | Wt 242.2 lb

## 2016-06-21 DIAGNOSIS — D721 Eosinophilia, unspecified: Secondary | ICD-10-CM

## 2016-06-21 DIAGNOSIS — J3089 Other allergic rhinitis: Secondary | ICD-10-CM | POA: Diagnosis not present

## 2016-06-21 MED ORDER — MONTELUKAST SODIUM 10 MG PO TABS: 10.0000 mg | ORAL_TABLET | Freq: Every day | ORAL | 5 refills | Status: DC

## 2016-06-21 MED ORDER — IPRATROPIUM BROMIDE 0.06 % NA SOLN: NASAL | 5 refills | Status: AC

## 2016-06-21 NOTE — Patient Instructions (Addendum)
  1. Allergen avoidance measures?  2. Discontinue chlorpheniramine and levocetirizine. Less tiredness?  3. Every day utilize the following medications:   A. montelukast 10 mg - one tablet 1 time in AM  B. loratadine 10 mg - two tablets 1 time in AM  C. ipratropium 0.06% - 2 sprays each nostril in AM  D. Nasacort - one spray each nostril in AM  4. If needed:   A. can increase ipratropium to 4 times a day  5. Return to clinic in 4 weeks. Further evaluation and treatment?

## 2016-06-21 NOTE — Progress Notes (Signed)
Dear Dr. Tiburcio Pea,  Thank you for referring Osvaldo Human to the [facility] [place] on 06/21/2016.   Below is a summation of this patient's evaluation and recommendations.  Thank you for your referral. I will keep you informed about this patient's response to treatment.   If you have any questions please do not hesitate to contact me.   Sincerely,  Jessica Priest, MD Allergy / Immunology [facility]   ______________________________________________________________________    NEW PATIENT NOTE  Referring Provider: Johny Blamer, MD Primary Provider: Johny Blamer, MD Date of office visit: 06/21/2016    Subjective:   Chief Complaint:  John Mitchell (DOB: 1956/01/03) is a 61 y.o. male who presents to the clinic on 06/21/2016 with a chief complaint of Nasal Congestion .     HPI: Marjorie presents to this clinic in evaluation of persistent rhinitic symptoms of many decades. He complains of having constant runny nose and sneezing with minimal nasal congestion and no anosmia and no ugly nasal discharge occurring on a perennial basis without any obvious trigger. He has apparently been evaluated by an allergist over 10 years ago and has been given multiple medications in the past. He believes that a combination of levocetirizine in the morning, Chlor-Trimeton tablets throughout the day, and nasal ipratropium in the morning does help his symptoms somewhat. He does not have a history of recurrent sinusitis. He does not have any other associated atopic symptoms.  However he is tired. He cannot understand why he feels fatigued throughout the day. At first he thought it was secondary to his age. He does snore but has no apneic issues at nighttime. He does not feel unrefreshed in the morning and does not take a nap during the daytime.  History reviewed. No pertinent past medical history.  Past  Surgical History:  Procedure Laterality Date  . HAND SURGERY  09/2013  . TONSILLECTOMY    . VASECTOMY      Allergies as of 06/21/2016   No Known Allergies     Medication List      CHLORPHEN 4 MG tablet Generic drug:  chlorpheniramine Take 4 mg by mouth 2 (two) times daily as needed for allergies.   citalopram 40 MG tablet Commonly known as:  CELEXA Take 40 mg by mouth daily.   ipratropium 0.06 % nasal spray Commonly known as:  ATROVENT Place 2 sprays into both nostrils daily as needed.   levocetirizine 5 MG tablet Commonly known as:  XYZAL Take 5 mg by mouth every evening.   multivitamin with minerals Tabs tablet Take 1 tablet by mouth daily.   NASACORT ALLERGY 24HR 55 MCG/ACT Aero nasal inhaler Generic drug:  triamcinolone Place 1 spray into the nose daily.   VITAMIN D PO Take by mouth.       Review of systems negative except as noted in HPI / PMHx or noted below:  Review of Systems  Constitutional: Negative.   HENT: Negative.   Eyes: Negative.   Respiratory: Negative.   Cardiovascular: Negative.   Gastrointestinal: Negative.   Genitourinary: Negative.   Musculoskeletal: Negative.   Skin: Negative.   Neurological: Negative.   Endo/Heme/Allergies: Negative.   Psychiatric/Behavioral: Negative.     Family History  Problem Relation Age of Onset  . Cancer Mother     Ovarian cancer  . Heart attack Father   . Dementia Father   . Heart attack Brother   .  Asthma Son     Social History   Social History  . Marital status: Married    Spouse name: N/A  . Number of children: N/A  . Years of education: N/A   Occupational History  . Not on file.   Social History Main Topics  . Smoking status: Former Smoker    Quit date: 03/13/1998  . Smokeless tobacco: Former NeurosurgeonUser    Types: Chew    Quit date: 08/11/2012  . Alcohol use No  . Drug use: No  . Sexual activity: Not on file   Other Topics Concern  . Not on file   Social History Narrative  . No  narrative on file    Environmental and Social history  Lives in a house with a dry environment, 2 dogs located inside the household, no carpeting in the bedroom, no plastic on the bed or pillow, and no smokers located inside the household. He is a past Building surveyorcontrol technician.  Objective:   Vitals:   06/21/16 0829  BP: 110/64  Pulse: 72  Resp: 16  Temp: 98.3 F (36.8 C)   Height: 6' 1.7" (187.2 cm) Weight: 242 lb 3.2 oz (109.9 kg)  Physical Exam  Constitutional: He is well-developed, well-nourished, and in no distress.  HENT:  Head: Normocephalic. Head is without right periorbital erythema and without left periorbital erythema.  Right Ear: Tympanic membrane, external ear and ear canal normal.  Left Ear: Tympanic membrane, external ear and ear canal normal.  Nose: Nose normal. No mucosal edema or rhinorrhea.  Mouth/Throat: Uvula is midline, oropharynx is clear and moist and mucous membranes are normal. No oropharyngeal exudate.  Eyes: Conjunctivae and lids are normal. Pupils are equal, round, and reactive to light.  Neck: Trachea normal. No tracheal tenderness present. No tracheal deviation present. No thyromegaly present.  Cardiovascular: Normal rate, regular rhythm, S1 normal, S2 normal and normal heart sounds.   No murmur heard. Pulmonary/Chest: Effort normal and breath sounds normal. No stridor. No tachypnea. No respiratory distress. He has no wheezes. He has no rales. He exhibits no tenderness.  Abdominal: Soft. He exhibits no distension and no mass. There is no hepatosplenomegaly. There is no tenderness. There is no rebound and no guarding.  Musculoskeletal: He exhibits no edema or tenderness.  Lymphadenopathy:       Head (right side): No tonsillar adenopathy present.       Head (left side): No tonsillar adenopathy present.    He has no cervical adenopathy.    He has no axillary adenopathy.  Neurological: He is alert. Gait normal.  Skin: No rash noted. He is not diaphoretic.  No erythema. No pallor. Nails show no clubbing.  Psychiatric: Mood and affect normal.    Diagnostics: Allergy skin tests were performed. He did not demonstrate any significant hypersensitivity directed against a screening panel of aeroallergens.  Results of an MRI of brain obtained 05/22/2016 identified the following:  Sinuses/Orbits: Globes and oval soft tissues within normal limits. Mild scattered mucosal thickening within the ethmoidal air cells and maxillary sinuses. No air-fluid level to suggest active sinus infection.  Results of blood tests obtained 03/15/2013 identified normal hepatic and renal function, white blood cell count 11.6 with a left shift, absolute eosinophil count 300, hemoglobin 15.4, platelet 2:15.  Assessment and Plan:    1. Other allergic rhinitis   2. Eosinophilia     1. Allergen avoidance measures?  2. Discontinue chlorpheniramine and levocetirizine. Less tiredness?  3. Every day utilize the following medications:  A. montelukast 10 mg - one tablet 1 time in AM  B. loratadine 10 mg - two tablets 1 time in AM  C. ipratropium 0.06% - 2 sprays each nostril in AM  D. Nasacort - one spray each nostril in AM  4. If needed:   A. can increase ipratropium to 4 times a day  5. Return to clinic in 4 weeks. Further evaluation and treatment?  There does not appear to be a significant atopic trigger giving rise to Prathik's persistent respiratory tract symptoms involving his upper airway. However, he does have eosinophilia and he probably has some form of eosinophilic driven inflammation affecting his upper airway for which we will have him utilize the anti-inflammatory medications noted above. I have removed his chlorpheniramine and his levocetirizine to see if this results in resolution of his tired feeling throughout the day. With persistent rhinorrhea one always consider the possibility of a CNS fluid leak but he has no other history to suggest such an  abnormality and his brain MRI in April 2018 did not identify any significant abnormality suggestive of a CNS leak. I will see him back in this clinic in 4 weeks or earlier if there is a problem.  Jessica Priest, MD [facility] [place]

## 2016-07-15 DIAGNOSIS — R944 Abnormal results of kidney function studies: Secondary | ICD-10-CM | POA: Diagnosis not present

## 2016-07-19 ENCOUNTER — Encounter: Payer: Self-pay | Admitting: Allergy and Immunology

## 2016-07-19 ENCOUNTER — Ambulatory Visit (INDEPENDENT_AMBULATORY_CARE_PROVIDER_SITE_OTHER): Payer: BLUE CROSS/BLUE SHIELD | Admitting: Allergy and Immunology

## 2016-07-19 VITALS — BP 118/66 | HR 88 | Resp 16

## 2016-07-19 DIAGNOSIS — J3089 Other allergic rhinitis: Secondary | ICD-10-CM | POA: Diagnosis not present

## 2016-07-19 NOTE — Patient Instructions (Addendum)
  1. Every day utilize the following medications:   A. montelukast 10 mg - one tablet 1 time in AM  B. loratadine 10 mg - two tablets 1 time in AM  C. ipratropium 0.06% - 2 sprays each nostril in AM  D. DISCONTINUE Nasacort    2. If needed:   A. can increase ipratropium to 4 times a day  3. Return to clinic in 6 months or earlier if problem.

## 2016-07-19 NOTE — Progress Notes (Signed)
Follow-up Note  Referring Provider: Johny Blamer, MD Primary Provider: Johny Blamer, MD Date of Office Visit: 07/19/2016  Subjective:   John Mitchell (DOB: 27-Nov-1955) is a 61 y.o. male who [facility] 07[facility]19/2016 in re-evaluation of the following:  HPI: Terral returns to this clinic in reevaluation of his persistent respiratory tract symptoms and fatigue addressed during his initial evaluation of 06/21/2016.  He is much better. Since he stopped his chlorpheniramine and levocetirizine he has no fatigue. As well, his upper airway issues are just doing great on his current medical therapy. He no longer has any rhinorrhea.  Allergies as of 07/19/2016   No Known Allergies     Medication List      CHLORPHEN 4 MG tablet Generic drug:  chlorpheniramine Take 4 mg by mouth 2 (two) times daily as needed for allergies.   citalopram 40 MG tablet Commonly known as:  CELEXA Take 40 mg by mouth daily.   ipratropium 0.06 % nasal spray Commonly known as:  ATROVENT Can use two sprays in each nostril up to four times a day as needed.   levocetirizine 5 MG tablet Commonly known as:  XYZAL Take 5 mg by mouth every evening.   montelukast 10 MG tablet Commonly known as:  SINGULAIR Take 1 tablet (10 mg total) by mouth daily.   multivitamin with minerals Tabs tablet Take 1 tablet by mouth daily.   NASACORT ALLERGY 24HR 55 MCG/ACT Aero nasal inhaler Generic drug:  triamcinolone Place 1 spray into the nose daily.   VITAMIN D PO Take by mouth.       No past medical history on file.  Past Surgical History:  Procedure Laterality Date  . HAND SURGERY  09/2013  . TONSILLECTOMY    . VASECTOMY      Review of systems negative except as noted in HPI / PMHx or noted below:  Review of Systems  Constitutional: Negative.   HENT: Negative.   Eyes: Negative.   Respiratory: Negative.   Cardiovascular: Negative.   Gastrointestinal: Negative.     Genitourinary: Negative.   Musculoskeletal: Negative.   Skin: Negative.   Neurological: Negative.   Endo/Heme/Allergies: Negative.   Psychiatric/Behavioral: Negative.      Objective:   Vitals:   07/19/16 0959  BP: 118/66  Pulse: 88  Resp: 16          Physical Exam  Constitutional: He is well-developed, well-nourished, and in no distress.  HENT:  Head: Normocephalic.  Right Ear: Tympanic membrane, external ear and ear canal normal.  Left Ear: Tympanic membrane, external ear and ear canal normal.  Nose: Nose normal. No mucosal edema or rhinorrhea.  Mouth/Throat: Uvula is midline, oropharynx is clear and moist and mucous membranes are normal. No oropharyngeal exudate.  Eyes: Conjunctivae are normal.  Neck: Trachea normal. No tracheal tenderness present. No tracheal deviation present. No thyromegaly present.  Cardiovascular: Normal rate, regular rhythm, S1 normal, S2 normal and normal heart sounds.   No murmur heard. Pulmonary/Chest: Breath sounds normal. No stridor. No respiratory distress. He has no wheezes. He has no rales.  Musculoskeletal: He exhibits no edema.  Lymphadenopathy:       Head (right side): No tonsillar adenopathy present.       Head (left side): No tonsillar adenopathy present.    He has no cervical adenopathy.  Neurological: He is alert. Gait normal.  Skin: No rash noted. He is not diaphoretic. No erythema. Nails show no clubbing.  Psychiatric: Mood and affect normal.    Diagnostics: none   Assessment and Plan:   1. Other allergic rhinitis     1. Every day utilize the following medications:   A. montelukast 10 mg - one tablet 1 time in AM  B. loratadine 10 mg - two tablets 1 time in AM  C. ipratropium 0.06% - 2 sprays each nostril in AM  D. DISCONTINUE Nasacort    2. If needed:   A. can increase ipratropium to 4 times a day  3. Return to clinic in 6 months or earlier if problem.  Dorene SorrowJerry appears to be doing very well and he appears to  have resolved a side effect from his medication use. It does appear as though chlorpheniramine and levocetirizine were giving rise to fatigue. I'm going to see if we can consolidate his medical therapy by discontinuing his Nasacort at this point and just having him continue on his other treatment. I will see him back in this clinic in 6 months or earlier if there is a problem.  Laurette SchimkeEric Kozlow, MD Allergy / Immunology [facility]

## 2016-10-17 ENCOUNTER — Other Ambulatory Visit: Payer: Self-pay | Admitting: Allergy and Immunology

## 2016-11-14 DIAGNOSIS — R42 Dizziness and giddiness: Secondary | ICD-10-CM | POA: Diagnosis not present

## 2017-01-21 ENCOUNTER — Other Ambulatory Visit: Payer: Self-pay | Admitting: Allergy and Immunology

## 2017-02-20 ENCOUNTER — Other Ambulatory Visit: Payer: Self-pay | Admitting: *Deleted

## 2017-02-21 ENCOUNTER — Other Ambulatory Visit: Payer: Self-pay

## 2017-02-24 ENCOUNTER — Other Ambulatory Visit: Payer: Self-pay | Admitting: Allergy and Immunology

## 2017-05-09 DIAGNOSIS — K529 Noninfective gastroenteritis and colitis, unspecified: Secondary | ICD-10-CM | POA: Diagnosis not present

## 2017-05-18 ENCOUNTER — Ambulatory Visit (INDEPENDENT_AMBULATORY_CARE_PROVIDER_SITE_OTHER): Payer: BLUE CROSS/BLUE SHIELD

## 2017-05-18 ENCOUNTER — Ambulatory Visit (INDEPENDENT_AMBULATORY_CARE_PROVIDER_SITE_OTHER): Payer: BLUE CROSS/BLUE SHIELD | Admitting: Podiatry

## 2017-05-18 ENCOUNTER — Encounter: Payer: Self-pay | Admitting: Podiatry

## 2017-05-18 DIAGNOSIS — Q828 Other specified congenital malformations of skin: Secondary | ICD-10-CM | POA: Diagnosis not present

## 2017-05-18 DIAGNOSIS — M2042 Other hammer toe(s) (acquired), left foot: Secondary | ICD-10-CM | POA: Diagnosis not present

## 2017-05-18 DIAGNOSIS — M722 Plantar fascial fibromatosis: Secondary | ICD-10-CM | POA: Diagnosis not present

## 2017-05-18 MED ORDER — MELOXICAM 15 MG PO TABS: 15.0000 mg | ORAL_TABLET | Freq: Every day | ORAL | 2 refills | Status: AC

## 2017-05-18 NOTE — Patient Instructions (Signed)

## 2017-05-23 NOTE — Progress Notes (Signed)
Subjective:   Patient ID: John Mitchell, male   DOB: 62 y.o.   MRN: [id number]   HPI 62 year old male presents the office with concerns of left fifth toe pain which is been ongoing for about 1 week as well as her pain to the bottom of the rightheel.  He denies any recent injury or trauma.  He denies any numbness or tingling denies any swelling.  He said no recent injury to the area.  He has no other concerns today.   Review of Systems  All other systems reviewed and are negative.  History reviewed. No pertinent past medical history.  Past Surgical History:  Procedure Laterality Date  . HAND SURGERY  09/2013  . TONSILLECTOMY    . VASECTOMY       Current Outpatient Medications:  .  chlorpheniramine (CHLORPHEN) 4 MG tablet, Take 4 mg by mouth 2 (two) times daily as needed for allergies., Disp: , Rfl:  .  Cholecalciferol (VITAMIN D PO), Take by mouth., Disp: , Rfl:  .  citalopram (CELEXA) 40 MG tablet, Take 40 mg by mouth daily., Disp: , Rfl: 0 .  ipratropium (ATROVENT) 0.06 % nasal spray, Can use two sprays in each nostril up to four times a day as needed., Disp: 15 mL, Rfl: 5 .  levocetirizine (XYZAL) 5 MG tablet, Take 5 mg by mouth every evening., Disp: , Rfl:  .  meloxicam (MOBIC) 15 MG tablet, Take 1 tablet (15 mg total) by mouth daily., Disp: 30 tablet, Rfl: 2 .  montelukast (SINGULAIR) 10 MG tablet, TAKE 1 TABLET BY MOUTH EVERY DAY, Disp: 30 tablet, Rfl: 0 .  Multiple Vitamin (MULTIVITAMIN WITH MINERALS) TABS tablet, Take 1 tablet by mouth daily., Disp: , Rfl:  .  triamcinolone (NASACORT ALLERGY 24HR) 55 MCG/ACT AERO nasal inhaler, Place 1 spray into the nose daily., Disp: , Rfl:   No Known Allergies  Social History   Socioeconomic History  . Marital status: Married    Spouse name: Not on file  . Number of children: Not on file  . Years of education: Not on file  . Highest education level: Not on file  Occupational History  . Not on file  Social Needs  .  Financial resource strain: Not on file  . Food insecurity:    Worry: Not on file    Inability: Not on file  . Transportation needs:    Medical: Not on file    Non-medical: Not on file  Tobacco Use  . Smoking status: Former Smoker    Last attempt to quit: 03/13/1998    Years since quitting: 19.2  . Smokeless tobacco: Former Neurosurgeon    Types: Chew    Quit date: 08/11/2012  Substance and Sexual Activity  . Alcohol use: No  . Drug use: No  . Sexual activity: Not on file  Lifestyle  . Physical activity:    Days per week: Not on file    Minutes per session: Not on file  . Stress: Not on file  Relationships  . Social connections:    Talks on phone: Not on file    Gets together: Not on file    Attends religious service: Not on file    Active member of club or organization: Not on file    Attends meetings of clubs or organizations: Not on file    Relationship status: Not on file  . Intimate partner violence:    Fear of current or ex partner: Not on file  Emotionally abused: Not on file    Physically abused: Not on file    Forced sexual activity: Not on file  Other Topics Concern  . Not on file  Social History Narrative  . Not on file        Objective:  Physical Exam  General: AAO x3, NAD  Dermatological: Hyperkeratotic lesion similar to 5 on the left foot.  No known ulceration drainage or any signs of infection.  No other open lesions or pre-ulcerative lesions identified today.  Vascular: Dorsalis Pedis artery and Posterior Tibial artery pedal pulses are 2/4 bilateral with immedate capillary fill time. There is no pain with calf compression, swelling, warmth, erythema.   Neruologic: Grossly intact via light touch bilateral. Vibratory intact via tuning fork bilateral. Protective threshold with Semmes Wienstein monofilament intact to all pedal sites bilateral. Negative tinel sign.   Musculoskeletal:Tenderness to palpation along the plantar medial tubercle of the calcaneus at  the insertion of plantar fascia on the right  foot. There is no pain along the course of the plantar fascia within the arch of the foot. Plantar fascia appears to be intact. There is no pain with lateral compression of the calcaneus or pain with vibratory sensation. There is no pain along the course or insertion of the achilles tendon. No other areas of tenderness to bilateral lower extremities. Muscular strength 5/5 in all groups tested bilateral.  Gait: Unassisted, Nonantalgic.       Assessment:   Right foot plantar fasciitis, left foot callus    Plan:  -Treatment options discussed including all alternatives, risks, and complications -Etiology of symptoms were discussed -X-rays were obtained and reviewed with the patient.  There is no definitive evidence of acute fracture or stress fracture identified today. -Hyperkeratotic lesion was sharply debrided x1 without any complications or bleeding.  Offloading. -In regards to the plantar fasciitis he wishes to proceed with a steroid injection of the right foot.  See procedure note below.  Plantar fascial brace was dispensed.  Discussed shoe modifications orthotics.  Stretching, icing exercises daily. -RTC 3 weeks or sooner if needed  Procedure: Injection Tendon/Ligament Discussed alternatives, risks, complications and verbal consent was obtained.  Location: Right plantar fascia at the glabrous junction; medial approach. Skin Prep: Alcohol. Injectate: 0.5 cc 0.5% marcaine plain, 0.5 cc 0.5% lidocaine plain and, 1 cc kenalog 10. Disposition: Patient tolerated procedure well. Injection site dressed with a band-aid.  Post-injection care was discussed and return precautions discussed.   Vivi BarrackMatthew R Wagoner DPM

## 2017-06-15 ENCOUNTER — Ambulatory Visit: Payer: BLUE CROSS/BLUE SHIELD | Admitting: Podiatry

## 2017-06-30 DIAGNOSIS — R42 Dizziness and giddiness: Secondary | ICD-10-CM | POA: Diagnosis not present

## 2017-08-01 DIAGNOSIS — E7800 Pure hypercholesterolemia, unspecified: Secondary | ICD-10-CM | POA: Diagnosis not present

## 2017-08-01 DIAGNOSIS — Z125 Encounter for screening for malignant neoplasm of prostate: Secondary | ICD-10-CM | POA: Diagnosis not present

## 2017-10-04 DIAGNOSIS — E7800 Pure hypercholesterolemia, unspecified: Secondary | ICD-10-CM | POA: Diagnosis not present

## 2017-10-04 DIAGNOSIS — Z23 Encounter for immunization: Secondary | ICD-10-CM | POA: Diagnosis not present

## 2017-10-04 DIAGNOSIS — F419 Anxiety disorder, unspecified: Secondary | ICD-10-CM | POA: Diagnosis not present

## 2017-10-04 DIAGNOSIS — Z0000 Encounter for general adult medical examination without abnormal findings: Secondary | ICD-10-CM | POA: Diagnosis not present

## 2017-10-30 ENCOUNTER — Other Ambulatory Visit: Payer: Self-pay | Admitting: Allergy and Immunology

## 2018-01-08 ENCOUNTER — Other Ambulatory Visit: Payer: Self-pay | Admitting: Podiatry

## 2018-02-25 DIAGNOSIS — J069 Acute upper respiratory infection, unspecified: Secondary | ICD-10-CM | POA: Diagnosis not present

## 2018-04-21 ENCOUNTER — Ambulatory Visit: Payer: Medicare HMO

## 2018-04-21 ENCOUNTER — Ambulatory Visit (INDEPENDENT_AMBULATORY_CARE_PROVIDER_SITE_OTHER): Payer: Medicare HMO | Admitting: Podiatry

## 2018-04-21 ENCOUNTER — Encounter: Payer: Self-pay | Admitting: Podiatry

## 2018-04-21 DIAGNOSIS — I742 Embolism and thrombosis of arteries of the upper extremities: Secondary | ICD-10-CM | POA: Insufficient documentation

## 2018-04-21 DIAGNOSIS — M779 Enthesopathy, unspecified: Secondary | ICD-10-CM

## 2018-04-21 DIAGNOSIS — Z87891 Personal history of nicotine dependence: Secondary | ICD-10-CM | POA: Insufficient documentation

## 2018-04-21 DIAGNOSIS — G5622 Lesion of ulnar nerve, left upper limb: Secondary | ICD-10-CM | POA: Insufficient documentation

## 2018-04-21 DIAGNOSIS — M722 Plantar fascial fibromatosis: Secondary | ICD-10-CM | POA: Diagnosis not present

## 2018-04-21 DIAGNOSIS — Q828 Other specified congenital malformations of skin: Secondary | ICD-10-CM | POA: Diagnosis not present

## 2018-04-21 DIAGNOSIS — I75012 Atheroembolism of left upper extremity: Secondary | ICD-10-CM | POA: Insufficient documentation

## 2018-04-21 DIAGNOSIS — G5602 Carpal tunnel syndrome, left upper limb: Secondary | ICD-10-CM | POA: Insufficient documentation

## 2018-04-21 MED ORDER — TRIAMCINOLONE ACETONIDE 10 MG/ML IJ SUSP
10.0000 mg | Freq: Once | INTRAMUSCULAR | Status: AC
  Administered 2018-04-21: 10 mg

## 2018-04-21 NOTE — Progress Notes (Signed)
Subjective: 63 year old male presents the office today for concerns of right heel pain.  I last saw him in 2019 for the same issue.  After I saw him last he states he was doing well and around January he started getting some reoccurrence of the pain he states it feels the same as it did previously.  He denies any recent injury or trauma.  Also the callus on the left foot is started to come back that he wants to have trimmed.  He did recently purchase new shoes as well as get super feet inserts.  He has not been stretching. Denies any systemic complaints such as fevers, chills, nausea, vomiting. No acute changes since last appointment, and no other complaints at this time.   Objective: AAO x3, NAD DP/PT pulses palpable bilaterally, CRT less than 3 seconds There is tenderness palpation of the plantar medial tubercle of the calcaneus as well as the plantar lateral aspect of the right heel.  No pain with lateral compression of the calcaneus.  Plantar fascial, Achilles tendon appear to be intact.  No pain in the Achilles tendon.  No other area of discomfort on the right foot.  On the left foot there is prominence of metatarsal heads with a callus formation fifth metatarsal head.  On debridement no underlying ulceration drainage or any signs of infection. No open lesions or pre-ulcerative lesions.  No pain with calf compression, swelling, warmth, erythema  Assessment: Right heel pain, plantar fasciitis, left foot porokeratosis  Plan: -All treatment options discussed with the patient including all alternatives, risks, complications.  -On the right foot steroid injection was performed today.  See procedure note below.  Discussed stretching, ice exercises daily.  Continue with super feet inserts as well as a plantar fascial brace. -I debrided the hyperkeratotic lesion the left foot without any complications or bleeding.  Discussed moisturizer daily.  I added metatarsal pad to his insert. -Patient encouraged  to call the office with any questions, concerns, change in symptoms.   Procedure: Injection Tendon/Ligament Discussed alternatives, risks, complications and verbal consent was obtained.  Location: RIGHT plantar fascia at the glabrous junction; medial and lateral approach. Skin Prep: Alcohol Injectate: 0.5cc 0.5% marcaine plain, 0.5 cc 2% lidocaine plain and, 1 cc kenalog 10. Disposition: Patient tolerated procedure well. Injection site dressed with a band-aid.  Post-injection care was discussed and return precautions discussed.    Return in about 3 weeks (around 05/12/2018).  Vivi Barrack DPM

## 2018-04-21 NOTE — Patient Instructions (Signed)

## 2018-05-11 ENCOUNTER — Other Ambulatory Visit: Payer: Self-pay

## 2018-05-11 ENCOUNTER — Ambulatory Visit (INDEPENDENT_AMBULATORY_CARE_PROVIDER_SITE_OTHER): Payer: Medicare HMO | Admitting: Podiatry

## 2018-05-11 VITALS — Temp 97.3°F

## 2018-05-11 DIAGNOSIS — M722 Plantar fascial fibromatosis: Secondary | ICD-10-CM | POA: Diagnosis not present

## 2018-05-11 MED ORDER — TRIAMCINOLONE ACETONIDE 10 MG/ML IJ SUSP
10.0000 mg | Freq: Once | INTRAMUSCULAR | Status: AC
  Administered 2018-05-11: 10 mg

## 2018-05-11 NOTE — Progress Notes (Signed)
Subjective: 63 year old male presents the office today for follow-up of right heel pain. He states that his is about 70% better. He is requesting another injection but only to the outside of the heel as that is where he gets the pain now. He was having some pain to the left big toe. That is getting better since the injection on the right foot.  Denies any systemic complaints such as fevers, chills, nausea, vomiting. No acute changes since last appointment, and no other complaints at this time.   Objective: AAO x3, NAD DP/PT pulses palpable bilaterally, CRT less than 3 seconds There is decreased tenderness palpation of the plantar medial tubercle of the calcaneus as well as the plantar lateral aspect of the right heel.  No pain with lateral compression of the calcaneus.  Plantar fascial, Achilles tendon appear to be intact. On the left foot there is prominence of metatarsal heads with minimal callus formation fifth metatarsal head.  Mild discomfort to the plantar lateral hallux IPJ. No overlying edema, erythema signs of infection. On debridement no underlying ulceration drainage or any signs of infection. Minimal hyperkeratotic tissue left 5th metatarsal.  No open lesions or pre-ulcerative lesions.  No pain with calf compression, swelling, warmth, erythema  Assessment: Right heel pain, plantar fasciitis, left foot pain  Plan: -All treatment options discussed with the patient including all alternatives, risks, complications.  -On the right foot steroid injection was performed today.  See procedure note below.  Discussed stretching, ice exercises daily.  Continue with super feet inserts as well as a plantar fascial brace. -Continue moisturizer to the callus area daily.  -Patient encouraged to call the office with any questions, concerns, change in symptoms.   Procedure: Injection Tendon/Ligament Discussed alternatives, risks, complications and verbal consent was obtained.  Location: RIGHT plantar  fascia at the glabrous junction; LATERAL approach. Skin Prep: Alcohol Injectate: 0.5cc 0.5% marcaine plain, 0.5 cc 2% lidocaine plain and, 1 cc kenalog 10. Disposition: Patient tolerated procedure well. Injection site dressed with a band-aid.  Post-injection care was discussed and return precautions discussed.   Return in about 3 weeks  Vivi Barrack DPM

## 2018-10-23 IMAGING — MR MR HEAD WO/W CM
11 of 12 series · 45 of 48 positions shown · IV contrast (multihance)
Comparison: None available.

CLINICAL DATA: Initial evaluation for right-sided sensorineural
hearing loss.

EXAM:
MRI HEAD WITHOUT AND WITH CONTRAST
TECHNIQUE: Multiplanar, multiecho pulse sequences of the brain and surrounding
structures were obtained without and with intravenous contrast. A
cranial nerve 8 protocol was employed.
CONTRAST:  20mL MULTIHANCE GADOBENATE DIMEGLUMINE 529 MG/ML IV SOLN

[Series 5: T1 · sagittal · 4.0mm · 0.72mm/px · 2 of 28 slices shown (1 of 3)]
[im 1/28]
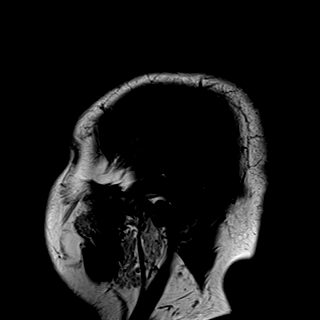
[im 28/28]
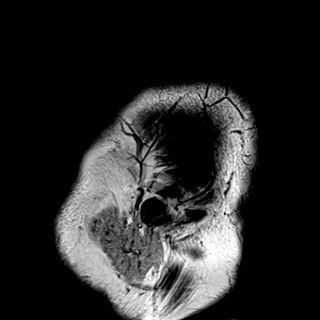

[Series 6: T2 · axial · 4.0mm · 0.36mm/px · z∈[-61,+85]mm · 3 of 30 slices shown]
[im 1/30]
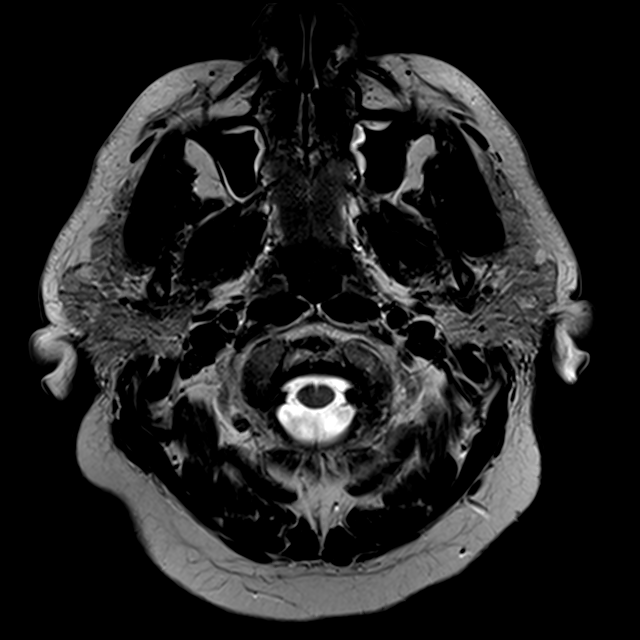
[im 15/30]
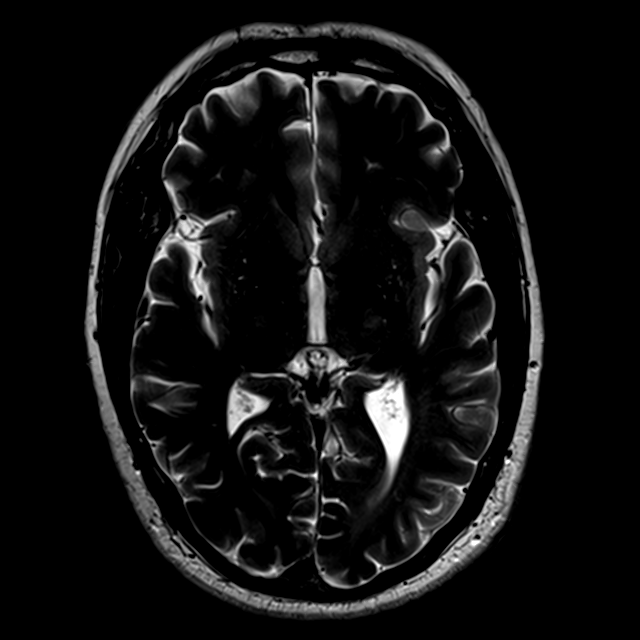
[im 30/30]
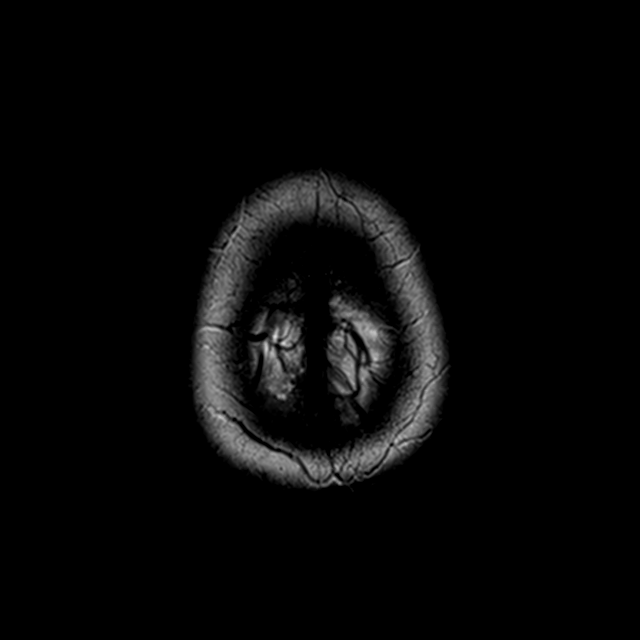

[Series 7: DWI · axial · 3.0mm · 1.44mm/px · z∈[-62,+86]mm · 8 of 88 slices shown (1 of 2)]
[im 1/88]
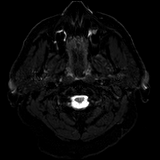
[im 13/88]
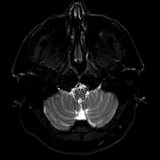
[im 25/88]
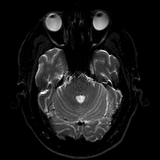
[im 38/88]
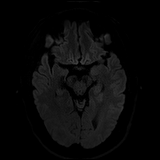
[im 50/88]
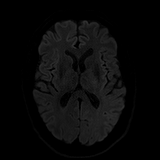
[im 63/88]
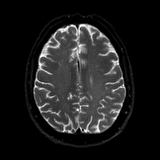
[im 75/88]
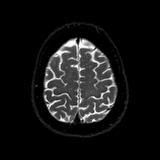
[im 88/88]
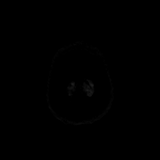

[Series 8: DWI · axial · 3.0mm · 1.44mm/px · z∈[-62,+86]mm · 4 of 44 slices shown (2 of 2)]
[im 1/44]
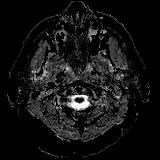
[im 15/44]
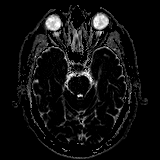
[im 29/44]
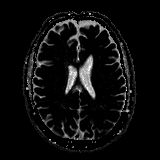
[im 44/44]
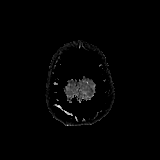

[Series 9: T1 · coronal · 2.5mm · 0.56mm/px · 1 of 13 slices shown (2 of 3)]
[im 1/13]
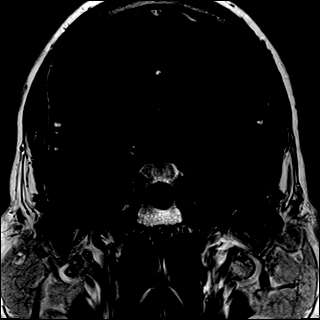

[Series 10: FLAIR · axial · 3.0mm · 0.72mm/px · z∈[-62,+85]mm · 3 of 40 slices shown]
[im 1/40]
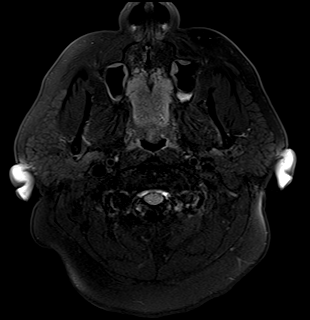
[im 20/40]
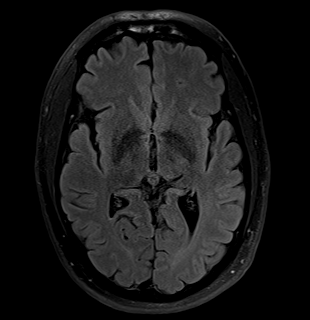
[im 40/40]
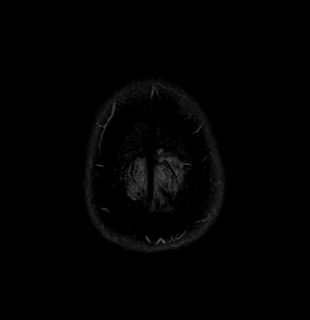

[Series 12: swi_images · axial · 2.0mm · 0.90mm/px · z∈[-64,+88]mm · 7 of 80 slices shown]
[im 1/80]
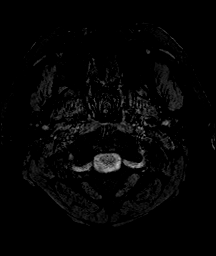
[im 14/80]
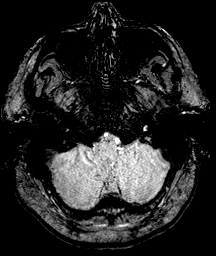
[im 27/80]
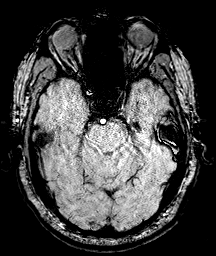
[im 40/80]
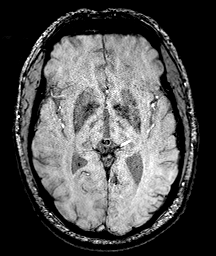
[im 53/80]
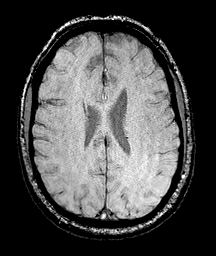
[im 66/80]
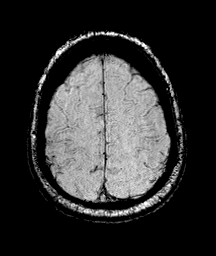
[im 80/80]
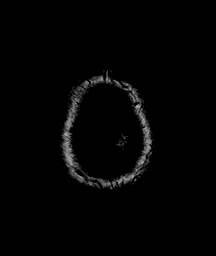

[Series 13: T1 · axial · 2.5mm · 0.50mm/px · 1 of 13 slices shown (3 of 3)]
[im 1/13]
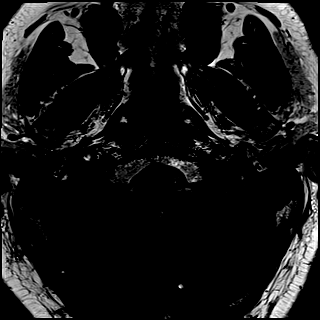

[Series 15: T1 post-contrast · coronal · 2.5mm · 0.56mm/px · 1 of 13 slices shown (1 of 3)]
[im 1/13]
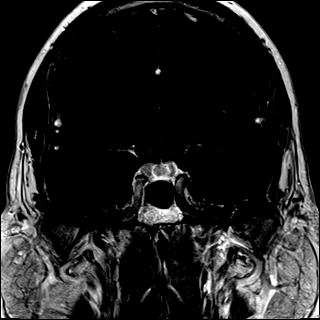

[Series 16: T1 post-contrast · axial · 2.5mm · 0.50mm/px · 1 of 13 slices shown (2 of 3)]
[im 1/13]
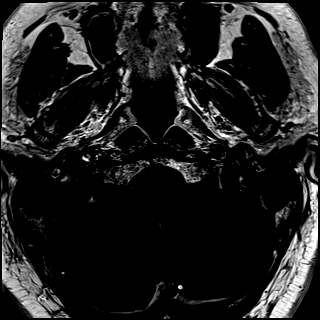

[Series 17: T1 post-contrast · axial · 1.0mm · 0.90mm/px · z∈[-65,+89]mm · 14 of 160 slices shown (3 of 3)]
[im 1/160]
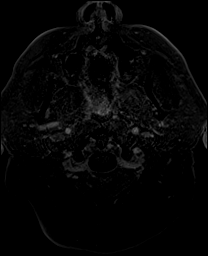
[im 13/160]
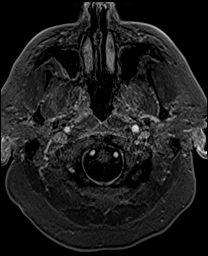
[im 25/160]
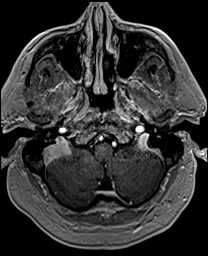
[im 37/160]
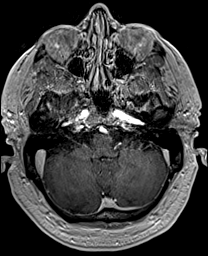
[im 49/160]
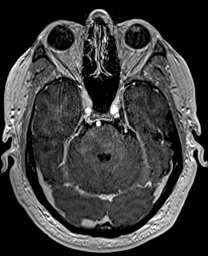
[im 62/160]
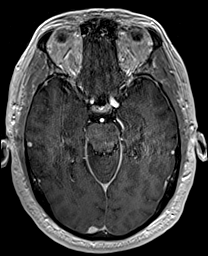
[im 74/160]
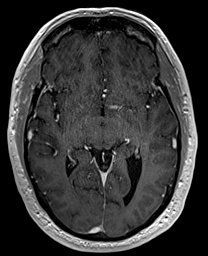
[im 86/160]
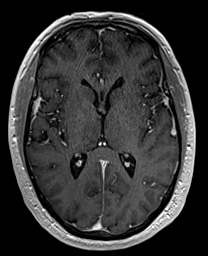
[im 98/160]
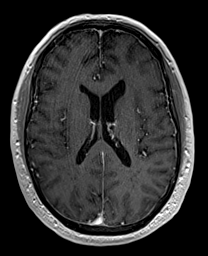
[im 111/160]
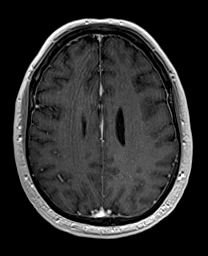
[im 123/160]
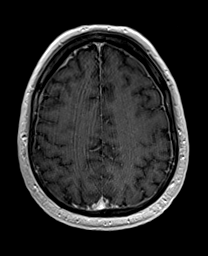
[im 135/160]
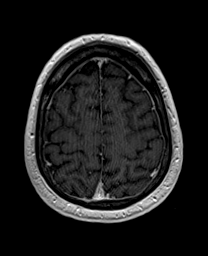
[im 147/160]
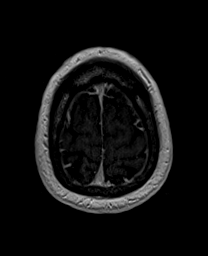
[im 160/160]
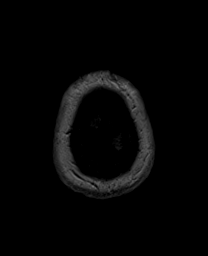

[45 of 48 positions shown; findings below may reference images not displayed]

FINDINGS: Brain: Generalized age-related cerebral volume loss present. Minimal
patchy T2/FLAIR hyperintensity within the periventricular white
matter, nonspecific, but most like related to chronic small vessel
ischemic disease. Changes felt to be within normal limits for age.

No abnormal foci of restricted diffusion to suggest acute or
subacute ischemia. No abnormal encephalomalacia to suggest chronic
infarction. No foci of susceptibility artifact to suggest acute or
chronic intracranial hemorrhage.

No mass lesion, midline shift or mass effect. Ventricles normal in
size without evidence for hydrocephalus. No extra-axial fluid
collection. No abnormal enhancement within the brain.

Pituitary gland suprasellar region within normal limits. Midline
structures intact and normal.

Dedicated thin section images through the cerebellopontine angles
were performed. Cerebellopontine angles are clear without mass
lesion or other abnormality. Seventh and eighth cranial nerves seen
coursing normally into the internal auditory canals. Inner ear
structures including the cochlea, vestibules, and semi circular
canals are normal. No abnormal enhancement. No mastoid effusion.

Vascular: Major intracranial vascular flow voids are well
maintained.

Skull and upper cervical spine: Craniocervical junction normal.
Visualized upper cervical spine unremarkable. Bone marrow signal
intensity within normal limits. No scalp soft tissue abnormality.

Sinuses/Orbits: Globes and oval soft tissues within normal limits.
Mild scattered mucosal thickening within the ethmoidal air cells and
maxillary sinuses. No air-fluid level to suggest active sinus
infection.

Other: No other significant finding.
IMPRESSION: Normal IAC protocol MRI of the brain. No structural findings to
explain patient's symptoms identified.

## 2018-11-05 DIAGNOSIS — H43813 Vitreous degeneration, bilateral: Secondary | ICD-10-CM | POA: Diagnosis not present

## 2018-11-05 DIAGNOSIS — H5203 Hypermetropia, bilateral: Secondary | ICD-10-CM | POA: Diagnosis not present

## 2018-11-05 DIAGNOSIS — H35371 Puckering of macula, right eye: Secondary | ICD-10-CM | POA: Diagnosis not present

## 2018-12-26 DIAGNOSIS — Z6833 Body mass index (BMI) 33.0-33.9, adult: Secondary | ICD-10-CM | POA: Diagnosis not present

## 2018-12-26 DIAGNOSIS — J309 Allergic rhinitis, unspecified: Secondary | ICD-10-CM | POA: Diagnosis not present

## 2018-12-26 DIAGNOSIS — E669 Obesity, unspecified: Secondary | ICD-10-CM | POA: Diagnosis not present

## 2018-12-26 DIAGNOSIS — Z8249 Family history of ischemic heart disease and other diseases of the circulatory system: Secondary | ICD-10-CM | POA: Diagnosis not present

## 2018-12-26 DIAGNOSIS — Z79899 Other long term (current) drug therapy: Secondary | ICD-10-CM | POA: Diagnosis not present

## 2018-12-26 DIAGNOSIS — Z87891 Personal history of nicotine dependence: Secondary | ICD-10-CM | POA: Diagnosis not present

## 2019-02-25 DIAGNOSIS — J300 Vasomotor rhinitis: Secondary | ICD-10-CM | POA: Diagnosis not present

## 2019-02-25 DIAGNOSIS — E7800 Pure hypercholesterolemia, unspecified: Secondary | ICD-10-CM | POA: Diagnosis not present

## 2019-02-25 DIAGNOSIS — M25531 Pain in right wrist: Secondary | ICD-10-CM | POA: Diagnosis not present

## 2019-02-25 DIAGNOSIS — Z125 Encounter for screening for malignant neoplasm of prostate: Secondary | ICD-10-CM | POA: Diagnosis not present

## 2019-02-25 DIAGNOSIS — L820 Inflamed seborrheic keratosis: Secondary | ICD-10-CM | POA: Diagnosis not present

## 2019-02-27 DIAGNOSIS — R69 Illness, unspecified: Secondary | ICD-10-CM | POA: Diagnosis not present

## 2019-04-03 DIAGNOSIS — Z86010 Personal history of colonic polyps: Secondary | ICD-10-CM | POA: Diagnosis not present

## 2019-04-03 DIAGNOSIS — Z1211 Encounter for screening for malignant neoplasm of colon: Secondary | ICD-10-CM | POA: Diagnosis not present

## 2019-05-27 DIAGNOSIS — J309 Allergic rhinitis, unspecified: Secondary | ICD-10-CM | POA: Diagnosis not present

## 2019-05-27 DIAGNOSIS — E7800 Pure hypercholesterolemia, unspecified: Secondary | ICD-10-CM | POA: Diagnosis not present

## 2019-05-27 DIAGNOSIS — N1831 Chronic kidney disease, stage 3a: Secondary | ICD-10-CM | POA: Diagnosis not present

## 2019-05-27 DIAGNOSIS — Z23 Encounter for immunization: Secondary | ICD-10-CM | POA: Diagnosis not present

## 2019-05-27 DIAGNOSIS — M19041 Primary osteoarthritis, right hand: Secondary | ICD-10-CM | POA: Diagnosis not present

## 2019-05-27 DIAGNOSIS — Z0000 Encounter for general adult medical examination without abnormal findings: Secondary | ICD-10-CM | POA: Diagnosis not present

## 2019-08-28 DIAGNOSIS — R69 Illness, unspecified: Secondary | ICD-10-CM | POA: Diagnosis not present

## 2019-09-05 DIAGNOSIS — M7711 Lateral epicondylitis, right elbow: Secondary | ICD-10-CM | POA: Diagnosis not present

## 2019-09-05 DIAGNOSIS — N1831 Chronic kidney disease, stage 3a: Secondary | ICD-10-CM | POA: Diagnosis not present

## 2019-09-23 DIAGNOSIS — Z8249 Family history of ischemic heart disease and other diseases of the circulatory system: Secondary | ICD-10-CM | POA: Diagnosis not present

## 2019-09-23 DIAGNOSIS — R030 Elevated blood-pressure reading, without diagnosis of hypertension: Secondary | ICD-10-CM | POA: Diagnosis not present

## 2019-09-23 DIAGNOSIS — E669 Obesity, unspecified: Secondary | ICD-10-CM | POA: Diagnosis not present

## 2019-09-23 DIAGNOSIS — Z809 Family history of malignant neoplasm, unspecified: Secondary | ICD-10-CM | POA: Diagnosis not present

## 2019-09-23 DIAGNOSIS — J309 Allergic rhinitis, unspecified: Secondary | ICD-10-CM | POA: Diagnosis not present

## 2019-09-23 DIAGNOSIS — Z87891 Personal history of nicotine dependence: Secondary | ICD-10-CM | POA: Diagnosis not present

## 2019-09-23 DIAGNOSIS — M1990 Unspecified osteoarthritis, unspecified site: Secondary | ICD-10-CM | POA: Diagnosis not present

## 2019-09-23 DIAGNOSIS — Z6832 Body mass index (BMI) 32.0-32.9, adult: Secondary | ICD-10-CM | POA: Diagnosis not present

## 2019-10-08 DIAGNOSIS — R69 Illness, unspecified: Secondary | ICD-10-CM | POA: Diagnosis not present

## 2020-02-04 ENCOUNTER — Ambulatory Visit: Payer: Medicare HMO | Admitting: Podiatry

## 2020-02-04 ENCOUNTER — Other Ambulatory Visit: Payer: Self-pay

## 2020-02-04 DIAGNOSIS — M216X9 Other acquired deformities of unspecified foot: Secondary | ICD-10-CM

## 2020-02-04 DIAGNOSIS — Q828 Other specified congenital malformations of skin: Secondary | ICD-10-CM

## 2020-02-04 DIAGNOSIS — M79672 Pain in left foot: Secondary | ICD-10-CM | POA: Diagnosis not present

## 2020-02-04 DIAGNOSIS — M79671 Pain in right foot: Secondary | ICD-10-CM

## 2020-02-10 NOTE — Progress Notes (Signed)
Subjective: 64 year old male presents the office today for concerns of calluses with his left foot worse than right.  It is starting to cause discomfort.  Points to submetatarsal 5.  Denies any open sores, redness drainage or any bleeding. Denies any systemic complaints such as fevers, chills, nausea, vomiting. No acute changes since last appointment, and no other complaints at this time.   Objective: AAO x3, NAD DP/PT pulses palpable bilaterally, CRT less than 3 seconds Hyperkeratotic lesions bilateral submetatarsal 5 left side worse than right.  There is no underlying ulceration drainage or any signs of infection today.  There is prominence of metatarsals plantarly.  No pain with calf compression, swelling, warmth, erythema  Assessment: Benign skin lesions bilaterally left side worse than right  Plan: -All treatment options discussed with the patient including all alternatives, risks, complications.  -Sharply debrided the lesions were any complications or bleeding.  Recommend moisturizer daily.  Offloading pads.  Consider accommodative orthotics as well. -Patient encouraged to call the office with any questions, concerns, change in symptoms.   Vivi Barrack DPM

## 2020-02-11 DIAGNOSIS — J309 Allergic rhinitis, unspecified: Secondary | ICD-10-CM | POA: Diagnosis not present

## 2020-02-11 DIAGNOSIS — M25511 Pain in right shoulder: Secondary | ICD-10-CM | POA: Diagnosis not present

## 2020-02-18 DIAGNOSIS — M67911 Unspecified disorder of synovium and tendon, right shoulder: Secondary | ICD-10-CM | POA: Diagnosis not present

## 2020-02-18 DIAGNOSIS — M25511 Pain in right shoulder: Secondary | ICD-10-CM | POA: Diagnosis not present

## 2020-02-18 DIAGNOSIS — M7711 Lateral epicondylitis, right elbow: Secondary | ICD-10-CM | POA: Diagnosis not present

## 2020-02-24 DIAGNOSIS — Z1152 Encounter for screening for COVID-19: Secondary | ICD-10-CM | POA: Diagnosis not present

## 2020-02-25 DIAGNOSIS — J019 Acute sinusitis, unspecified: Secondary | ICD-10-CM | POA: Diagnosis not present

## 2020-03-23 DIAGNOSIS — M7501 Adhesive capsulitis of right shoulder: Secondary | ICD-10-CM | POA: Diagnosis not present

## 2020-03-26 DIAGNOSIS — M7501 Adhesive capsulitis of right shoulder: Secondary | ICD-10-CM | POA: Diagnosis not present

## 2020-03-31 DIAGNOSIS — M7501 Adhesive capsulitis of right shoulder: Secondary | ICD-10-CM | POA: Diagnosis not present

## 2020-04-02 DIAGNOSIS — M7501 Adhesive capsulitis of right shoulder: Secondary | ICD-10-CM | POA: Diagnosis not present

## 2020-04-09 DIAGNOSIS — M7501 Adhesive capsulitis of right shoulder: Secondary | ICD-10-CM | POA: Diagnosis not present

## 2020-04-13 DIAGNOSIS — M7501 Adhesive capsulitis of right shoulder: Secondary | ICD-10-CM | POA: Diagnosis not present

## 2020-04-16 DIAGNOSIS — M7501 Adhesive capsulitis of right shoulder: Secondary | ICD-10-CM | POA: Diagnosis not present

## 2020-04-20 DIAGNOSIS — M7501 Adhesive capsulitis of right shoulder: Secondary | ICD-10-CM | POA: Diagnosis not present

## 2020-04-23 DIAGNOSIS — M7501 Adhesive capsulitis of right shoulder: Secondary | ICD-10-CM | POA: Diagnosis not present

## 2020-04-24 DIAGNOSIS — M67911 Unspecified disorder of synovium and tendon, right shoulder: Secondary | ICD-10-CM | POA: Diagnosis not present

## 2020-04-24 DIAGNOSIS — M25521 Pain in right elbow: Secondary | ICD-10-CM | POA: Diagnosis not present

## 2020-05-05 ENCOUNTER — Other Ambulatory Visit: Payer: Self-pay

## 2020-05-05 DIAGNOSIS — M67911 Unspecified disorder of synovium and tendon, right shoulder: Secondary | ICD-10-CM

## 2020-05-18 DIAGNOSIS — Z8041 Family history of malignant neoplasm of ovary: Secondary | ICD-10-CM | POA: Diagnosis not present

## 2020-05-18 DIAGNOSIS — J309 Allergic rhinitis, unspecified: Secondary | ICD-10-CM | POA: Diagnosis not present

## 2020-05-18 DIAGNOSIS — M1990 Unspecified osteoarthritis, unspecified site: Secondary | ICD-10-CM | POA: Diagnosis not present

## 2020-05-18 DIAGNOSIS — Z8249 Family history of ischemic heart disease and other diseases of the circulatory system: Secondary | ICD-10-CM | POA: Diagnosis not present

## 2020-05-18 DIAGNOSIS — R030 Elevated blood-pressure reading, without diagnosis of hypertension: Secondary | ICD-10-CM | POA: Diagnosis not present

## 2020-05-18 DIAGNOSIS — E669 Obesity, unspecified: Secondary | ICD-10-CM | POA: Diagnosis not present

## 2020-05-18 DIAGNOSIS — Z6831 Body mass index (BMI) 31.0-31.9, adult: Secondary | ICD-10-CM | POA: Diagnosis not present

## 2020-05-18 DIAGNOSIS — Z87891 Personal history of nicotine dependence: Secondary | ICD-10-CM | POA: Diagnosis not present

## 2020-05-18 DIAGNOSIS — Z820 Family history of epilepsy and other diseases of the nervous system: Secondary | ICD-10-CM | POA: Diagnosis not present

## 2020-05-18 DIAGNOSIS — R69 Illness, unspecified: Secondary | ICD-10-CM | POA: Diagnosis not present

## 2020-05-25 ENCOUNTER — Other Ambulatory Visit: Payer: Self-pay

## 2020-05-25 ENCOUNTER — Ambulatory Visit
Admission: RE | Admit: 2020-05-25 | Discharge: 2020-05-25 | Disposition: A | Discharge status: Home / Self Care | Payer: BLUE CROSS/BLUE SHIELD | Source: Ambulatory Visit | Attending: Neurosurgery | Admitting: Neurosurgery

## 2020-05-25 DIAGNOSIS — M67911 Unspecified disorder of synovium and tendon, right shoulder: Secondary | ICD-10-CM

## 2020-05-25 DIAGNOSIS — M25511 Pain in right shoulder: Secondary | ICD-10-CM | POA: Diagnosis not present

## 2020-05-27 ENCOUNTER — Other Ambulatory Visit (HOSPITAL_COMMUNITY): Payer: Self-pay | Admitting: Orthopedic Surgery

## 2020-05-27 DIAGNOSIS — M67911 Unspecified disorder of synovium and tendon, right shoulder: Secondary | ICD-10-CM

## 2020-06-01 DIAGNOSIS — M7501 Adhesive capsulitis of right shoulder: Secondary | ICD-10-CM | POA: Diagnosis not present

## 2020-06-01 DIAGNOSIS — M25511 Pain in right shoulder: Secondary | ICD-10-CM | POA: Diagnosis not present

## 2020-06-04 DIAGNOSIS — E7800 Pure hypercholesterolemia, unspecified: Secondary | ICD-10-CM | POA: Diagnosis not present

## 2020-06-04 DIAGNOSIS — Z0000 Encounter for general adult medical examination without abnormal findings: Secondary | ICD-10-CM | POA: Diagnosis not present

## 2020-06-04 DIAGNOSIS — R69 Illness, unspecified: Secondary | ICD-10-CM | POA: Diagnosis not present

## 2020-06-04 DIAGNOSIS — J300 Vasomotor rhinitis: Secondary | ICD-10-CM | POA: Diagnosis not present

## 2020-06-04 DIAGNOSIS — R946 Abnormal results of thyroid function studies: Secondary | ICD-10-CM | POA: Diagnosis not present

## 2020-06-04 DIAGNOSIS — Z125 Encounter for screening for malignant neoplasm of prostate: Secondary | ICD-10-CM | POA: Diagnosis not present

## 2020-06-08 DIAGNOSIS — Y999 Unspecified external cause status: Secondary | ICD-10-CM | POA: Diagnosis not present

## 2020-06-08 DIAGNOSIS — G8918 Other acute postprocedural pain: Secondary | ICD-10-CM | POA: Diagnosis not present

## 2020-06-08 DIAGNOSIS — M25611 Stiffness of right shoulder, not elsewhere classified: Secondary | ICD-10-CM | POA: Diagnosis not present

## 2020-06-08 DIAGNOSIS — S43431A Superior glenoid labrum lesion of right shoulder, initial encounter: Secondary | ICD-10-CM | POA: Diagnosis not present

## 2020-06-08 DIAGNOSIS — X58XXXA Exposure to other specified factors, initial encounter: Secondary | ICD-10-CM | POA: Diagnosis not present

## 2020-06-08 DIAGNOSIS — M7521 Bicipital tendinitis, right shoulder: Secondary | ICD-10-CM | POA: Diagnosis not present

## 2020-06-08 DIAGNOSIS — M75111 Incomplete rotator cuff tear or rupture of right shoulder, not specified as traumatic: Secondary | ICD-10-CM | POA: Diagnosis not present

## 2020-06-09 DIAGNOSIS — M25611 Stiffness of right shoulder, not elsewhere classified: Secondary | ICD-10-CM | POA: Diagnosis not present

## 2020-06-10 DIAGNOSIS — M25611 Stiffness of right shoulder, not elsewhere classified: Secondary | ICD-10-CM | POA: Diagnosis not present

## 2020-06-16 DIAGNOSIS — M25611 Stiffness of right shoulder, not elsewhere classified: Secondary | ICD-10-CM | POA: Diagnosis not present

## 2020-06-18 DIAGNOSIS — M25611 Stiffness of right shoulder, not elsewhere classified: Secondary | ICD-10-CM | POA: Diagnosis not present

## 2020-06-19 DIAGNOSIS — J0100 Acute maxillary sinusitis, unspecified: Secondary | ICD-10-CM | POA: Diagnosis not present

## 2020-06-19 DIAGNOSIS — J301 Allergic rhinitis due to pollen: Secondary | ICD-10-CM | POA: Diagnosis not present

## 2020-06-30 DIAGNOSIS — M25611 Stiffness of right shoulder, not elsewhere classified: Secondary | ICD-10-CM | POA: Diagnosis not present

## 2020-07-02 DIAGNOSIS — M25611 Stiffness of right shoulder, not elsewhere classified: Secondary | ICD-10-CM | POA: Diagnosis not present

## 2020-07-07 DIAGNOSIS — M25611 Stiffness of right shoulder, not elsewhere classified: Secondary | ICD-10-CM | POA: Diagnosis not present

## 2020-07-07 DIAGNOSIS — J0101 Acute recurrent maxillary sinusitis: Secondary | ICD-10-CM | POA: Diagnosis not present

## 2020-07-08 DIAGNOSIS — M25611 Stiffness of right shoulder, not elsewhere classified: Secondary | ICD-10-CM | POA: Diagnosis not present

## 2020-07-15 DIAGNOSIS — M25611 Stiffness of right shoulder, not elsewhere classified: Secondary | ICD-10-CM | POA: Diagnosis not present

## 2020-07-16 DIAGNOSIS — H5203 Hypermetropia, bilateral: Secondary | ICD-10-CM | POA: Diagnosis not present

## 2020-07-16 DIAGNOSIS — H35371 Puckering of macula, right eye: Secondary | ICD-10-CM | POA: Diagnosis not present

## 2020-07-16 DIAGNOSIS — H524 Presbyopia: Secondary | ICD-10-CM | POA: Diagnosis not present

## 2020-07-17 DIAGNOSIS — M25611 Stiffness of right shoulder, not elsewhere classified: Secondary | ICD-10-CM | POA: Diagnosis not present

## 2020-07-21 DIAGNOSIS — M25611 Stiffness of right shoulder, not elsewhere classified: Secondary | ICD-10-CM | POA: Diagnosis not present

## 2020-07-23 DIAGNOSIS — M25611 Stiffness of right shoulder, not elsewhere classified: Secondary | ICD-10-CM | POA: Diagnosis not present

## 2020-07-28 DIAGNOSIS — M25611 Stiffness of right shoulder, not elsewhere classified: Secondary | ICD-10-CM | POA: Diagnosis not present

## 2020-07-30 DIAGNOSIS — M25611 Stiffness of right shoulder, not elsewhere classified: Secondary | ICD-10-CM | POA: Diagnosis not present

## 2020-08-04 DIAGNOSIS — M25611 Stiffness of right shoulder, not elsewhere classified: Secondary | ICD-10-CM | POA: Diagnosis not present

## 2020-08-06 DIAGNOSIS — M25611 Stiffness of right shoulder, not elsewhere classified: Secondary | ICD-10-CM | POA: Diagnosis not present

## 2020-08-11 DIAGNOSIS — M25611 Stiffness of right shoulder, not elsewhere classified: Secondary | ICD-10-CM | POA: Diagnosis not present

## 2020-08-13 DIAGNOSIS — M25611 Stiffness of right shoulder, not elsewhere classified: Secondary | ICD-10-CM | POA: Diagnosis not present

## 2020-08-19 DIAGNOSIS — M25611 Stiffness of right shoulder, not elsewhere classified: Secondary | ICD-10-CM | POA: Diagnosis not present

## 2020-08-20 DIAGNOSIS — M25611 Stiffness of right shoulder, not elsewhere classified: Secondary | ICD-10-CM | POA: Diagnosis not present

## 2020-08-25 DIAGNOSIS — M25611 Stiffness of right shoulder, not elsewhere classified: Secondary | ICD-10-CM | POA: Diagnosis not present

## 2020-08-27 DIAGNOSIS — M25611 Stiffness of right shoulder, not elsewhere classified: Secondary | ICD-10-CM | POA: Diagnosis not present

## 2020-09-01 DIAGNOSIS — M25611 Stiffness of right shoulder, not elsewhere classified: Secondary | ICD-10-CM | POA: Diagnosis not present

## 2020-09-04 DIAGNOSIS — M25611 Stiffness of right shoulder, not elsewhere classified: Secondary | ICD-10-CM | POA: Diagnosis not present

## 2020-09-07 DIAGNOSIS — M25611 Stiffness of right shoulder, not elsewhere classified: Secondary | ICD-10-CM | POA: Diagnosis not present

## 2020-09-09 DIAGNOSIS — M25611 Stiffness of right shoulder, not elsewhere classified: Secondary | ICD-10-CM | POA: Diagnosis not present

## 2020-09-15 DIAGNOSIS — M25611 Stiffness of right shoulder, not elsewhere classified: Secondary | ICD-10-CM | POA: Diagnosis not present

## 2020-09-17 DIAGNOSIS — M25611 Stiffness of right shoulder, not elsewhere classified: Secondary | ICD-10-CM | POA: Diagnosis not present

## 2020-09-22 DIAGNOSIS — U071 COVID-19: Secondary | ICD-10-CM | POA: Diagnosis not present

## 2021-02-05 DIAGNOSIS — Z0100 Encounter for examination of eyes and vision without abnormal findings: Secondary | ICD-10-CM | POA: Diagnosis not present

## 2021-04-12 DIAGNOSIS — J069 Acute upper respiratory infection, unspecified: Secondary | ICD-10-CM | POA: Diagnosis not present

## 2021-04-27 DIAGNOSIS — H6121 Impacted cerumen, right ear: Secondary | ICD-10-CM | POA: Diagnosis not present

## 2021-04-27 DIAGNOSIS — J019 Acute sinusitis, unspecified: Secondary | ICD-10-CM | POA: Diagnosis not present

## 2021-06-17 DIAGNOSIS — Z0100 Encounter for examination of eyes and vision without abnormal findings: Secondary | ICD-10-CM | POA: Diagnosis not present

## 2021-06-18 DIAGNOSIS — R69 Illness, unspecified: Secondary | ICD-10-CM | POA: Diagnosis not present

## 2021-06-18 DIAGNOSIS — R946 Abnormal results of thyroid function studies: Secondary | ICD-10-CM | POA: Diagnosis not present

## 2021-06-18 DIAGNOSIS — E7800 Pure hypercholesterolemia, unspecified: Secondary | ICD-10-CM | POA: Diagnosis not present

## 2021-06-18 DIAGNOSIS — J301 Allergic rhinitis due to pollen: Secondary | ICD-10-CM | POA: Diagnosis not present

## 2021-06-18 DIAGNOSIS — Z0000 Encounter for general adult medical examination without abnormal findings: Secondary | ICD-10-CM | POA: Diagnosis not present

## 2021-06-18 DIAGNOSIS — Z125 Encounter for screening for malignant neoplasm of prostate: Secondary | ICD-10-CM | POA: Diagnosis not present

## 2021-06-18 DIAGNOSIS — Z23 Encounter for immunization: Secondary | ICD-10-CM | POA: Diagnosis not present

## 2021-08-09 DIAGNOSIS — Z809 Family history of malignant neoplasm, unspecified: Secondary | ICD-10-CM | POA: Diagnosis not present

## 2021-08-09 DIAGNOSIS — R030 Elevated blood-pressure reading, without diagnosis of hypertension: Secondary | ICD-10-CM | POA: Diagnosis not present

## 2021-08-09 DIAGNOSIS — J301 Allergic rhinitis due to pollen: Secondary | ICD-10-CM | POA: Diagnosis not present

## 2021-08-09 DIAGNOSIS — Z87891 Personal history of nicotine dependence: Secondary | ICD-10-CM | POA: Diagnosis not present

## 2021-08-09 DIAGNOSIS — Z6833 Body mass index (BMI) 33.0-33.9, adult: Secondary | ICD-10-CM | POA: Diagnosis not present

## 2021-08-09 DIAGNOSIS — Z8249 Family history of ischemic heart disease and other diseases of the circulatory system: Secondary | ICD-10-CM | POA: Diagnosis not present

## 2021-08-09 DIAGNOSIS — Z79899 Other long term (current) drug therapy: Secondary | ICD-10-CM | POA: Diagnosis not present

## 2021-08-09 DIAGNOSIS — E669 Obesity, unspecified: Secondary | ICD-10-CM | POA: Diagnosis not present

## 2021-08-09 DIAGNOSIS — I951 Orthostatic hypotension: Secondary | ICD-10-CM | POA: Diagnosis not present

## 2021-09-01 DIAGNOSIS — H5203 Hypermetropia, bilateral: Secondary | ICD-10-CM | POA: Diagnosis not present

## 2021-09-01 DIAGNOSIS — H35371 Puckering of macula, right eye: Secondary | ICD-10-CM | POA: Diagnosis not present

## 2021-09-29 DIAGNOSIS — E039 Hypothyroidism, unspecified: Secondary | ICD-10-CM | POA: Diagnosis not present

## 2021-11-16 DIAGNOSIS — J069 Acute upper respiratory infection, unspecified: Secondary | ICD-10-CM | POA: Diagnosis not present

## 2021-11-16 DIAGNOSIS — R051 Acute cough: Secondary | ICD-10-CM | POA: Diagnosis not present

## 2021-11-24 DIAGNOSIS — R0602 Shortness of breath: Secondary | ICD-10-CM | POA: Diagnosis not present

## 2022-03-15 ENCOUNTER — Other Ambulatory Visit: Payer: Self-pay | Admitting: Family Medicine

## 2022-03-15 DIAGNOSIS — G453 Amaurosis fugax: Secondary | ICD-10-CM

## 2022-03-15 DIAGNOSIS — E7800 Pure hypercholesterolemia, unspecified: Secondary | ICD-10-CM | POA: Diagnosis not present

## 2022-03-15 DIAGNOSIS — L989 Disorder of the skin and subcutaneous tissue, unspecified: Secondary | ICD-10-CM | POA: Diagnosis not present

## 2022-03-15 DIAGNOSIS — R2689 Other abnormalities of gait and mobility: Secondary | ICD-10-CM | POA: Diagnosis not present

## 2022-03-16 ENCOUNTER — Encounter: Payer: Self-pay | Admitting: Family Medicine

## 2022-03-16 ENCOUNTER — Other Ambulatory Visit: Payer: Self-pay | Admitting: Family Medicine

## 2022-03-16 DIAGNOSIS — G453 Amaurosis fugax: Secondary | ICD-10-CM

## 2022-03-22 ENCOUNTER — Ambulatory Visit
Admission: RE | Admit: 2022-03-22 | Discharge: 2022-03-22 | Disposition: A | Discharge status: Home / Self Care | Payer: Medicare HMO | Source: Ambulatory Visit | Attending: Family Medicine | Admitting: Family Medicine

## 2022-03-22 DIAGNOSIS — G453 Amaurosis fugax: Secondary | ICD-10-CM | POA: Diagnosis not present

## 2022-03-29 ENCOUNTER — Other Ambulatory Visit: Payer: Medicare HMO

## 2022-04-09 ENCOUNTER — Ambulatory Visit
Admission: RE | Admit: 2022-04-09 | Discharge: 2022-04-09 | Disposition: A | Discharge status: Home / Self Care | Payer: Medicare HMO | Source: Ambulatory Visit | Attending: Family Medicine | Admitting: Family Medicine

## 2022-04-09 DIAGNOSIS — G453 Amaurosis fugax: Secondary | ICD-10-CM | POA: Diagnosis not present

## 2022-04-09 MED ORDER — GADOPICLENOL 0.5 MMOL/ML IV SOLN
10.0000 mL | Freq: Once | INTRAVENOUS | Status: AC | PRN
  Administered 2022-04-09: 10 mL via INTRAVENOUS

## 2022-04-18 ENCOUNTER — Encounter: Payer: Self-pay | Admitting: Neurology

## 2022-04-18 ENCOUNTER — Ambulatory Visit: Payer: Medicare HMO | Admitting: Neurology

## 2022-04-18 VITALS — BP 120/75 | HR 85 | Ht 74.0 in | Wt 266.5 lb

## 2022-04-18 DIAGNOSIS — G459 Transient cerebral ischemic attack, unspecified: Secondary | ICD-10-CM

## 2022-04-18 DIAGNOSIS — G4710 Hypersomnia, unspecified: Secondary | ICD-10-CM | POA: Diagnosis not present

## 2022-04-18 NOTE — Progress Notes (Signed)
Chief Complaint  Patient presents with   New Patient (Initial Visit)    Rm 12. Accompanied by wife. NP Paper proficient referral for Episode of visual loss,? Amaurosis fugax / Shirline Frees MD [facility].      ASSESSMENT AND PLAN  John Mitchell is a 67 y.o. male   Transient focal neurological symptoms,  Left visual field deficit, vertigo, all in the territory of posterior circulation, most worried about the posterior TIA  He does has vascular risk factor of obesity, aging, at risk for obstructive sleep apnea  MRI of the brain showed no acute abnormality  Ultrasound of carotid artery showed no large vessel disease  Complete evaluation with echocardiogram, EKG  Emphasized importance of moderate exercise, water intake  At risk for obstructive sleep apnea  Refer to sleep study   DIAGNOSTIC DATA (LABS, IMAGING, TESTING) - I reviewed patient records, labs, notes, testing and imaging myself where available.   MEDICAL HISTORY:  John Mitchell is a 68 year old male, seen in request by his primary care physician Dr. Kenton Kingfisher, Gwyndolyn Saxon, for evaluation of episode of vision loss, initial evaluation was on April 18, 2022 with his wife   I reviewed and summarized the referring note. PMHX. Obesity. Anxiety.  He retired, enjoying yard work, but in [employer], tends to be sedentary, he reported 2 episode of similar event,  In January 2024, while standing there talking, he suddenly felt left visual field deficit, as if a black curtain coming from the left side, lasting for few seconds, went away  Few hours later, he went to a friend's funeral, was standing talking, he suddenly felt dizziness, vertigo, he has to sit down, again lasting for few seconds, he denies heart racing fast, no chest pain, no persistent motor or sensory deficit  He snores, excessive daytime sleepiness, fatigue  PHYSICAL EXAM:   Vitals:   04/18/22 1616  BP: 120/75  Pulse: 85  Weight: 266 lb 8 oz  (120.9 kg)  Height: 6\' 2"  (1.88 m)   Not recorded     Body mass index is 34.22 kg/m.  PHYSICAL EXAMNIATION:  Gen: NAD, conversant, well nourised, well groomed                     Cardiovascular: Regular rate rhythm, no peripheral edema, warm, nontender. Eyes: Conjunctivae clear without exudates or hemorrhage Neck: Supple, no carotid bruits. Pulmonary: Clear to auscultation bilaterally   NEUROLOGICAL EXAM:  MENTAL STATUS: Speech/cognition: Awake, alert, oriented to history taking and casual conversation CRANIAL NERVES: CN II: Visual fields are full to confrontation. Pupils are round equal and briskly reactive to light. CN III, IV, VI: extraocular movement are normal. No ptosis. CN V: Facial sensation is intact to light touch CN VII: Face is symmetric with normal eye closure  CN VIII: Hearing is normal to causal conversation. CN IX, X: Phonation is normal. CN XI: Head turning and shoulder shrug are intact CN XII: Narrow oropharyngeal space  MOTOR: There is no pronator drift of out-stretched arms. Muscle bulk and tone are normal. Muscle strength is normal.  REFLEXES: Reflexes are 2+ and symmetric at the biceps, triceps, knees, and ankles. Plantar responses are flexor.  SENSORY: Intact to light touch, pinprick and vibratory sensation are intact in fingers and toes.  COORDINATION: There is no trunk or limb dysmetria noted.  GAIT/STANCE: Posture is normal. Gait is steady with normal steps, base, arm swing, and turning. Heel and toe walking are normal. Tandem gait is normal.  Romberg is absent.  REVIEW OF SYSTEMS:  Full 14 system review of systems performed and notable only for as above All other review of systems were negative.   ALLERGIES: No Known Allergies  HOME MEDICATIONS: Current Outpatient Medications  Medication Sig Dispense Refill   Cholecalciferol (VITAMIN D PO) Take by mouth.     citalopram (CELEXA) 40 MG tablet Take 40 mg by mouth daily.  0    ipratropium (ATROVENT) 0.06 % nasal spray Can use two sprays in each nostril up to four times a day as needed. 15 mL 5   levocetirizine (XYZAL) 5 MG tablet Take 5 mg by mouth every evening.     Loratadine (CLARITIN PO) Take 1 tablet by mouth as needed.     Multiple Vitamin (MULTIVITAMIN WITH MINERALS) TABS tablet Take 1 tablet by mouth daily.     No current facility-administered medications for this visit.    PAST MEDICAL HISTORY: Past Medical History:  Diagnosis Date   Acquired hypothyroidism    Allergic rhinitis    Anxiety    Peripheral vascular disease ([place])    Primary osteoarthritis    Vasomotor rhinitis     PAST SURGICAL HISTORY: Past Surgical History:  Procedure Laterality Date   HAND SURGERY  09/2013   TONSILLECTOMY     VASECTOMY      FAMILY HISTORY: Family History  Problem Relation Age of Onset   Cancer Mother        Ovarian cancer   Heart attack Father    Dementia Father    Heart attack Brother    Asthma Son     SOCIAL HISTORY: Social History   Socioeconomic History   Marital status: Married    Spouse name: Not on file   Number of children: Not on file   Years of education: Not on file   Highest education level: Not on file  Occupational History   Not on file  Tobacco Use   Smoking status: Former   Smokeless tobacco: Former    Types: Chew    Quit date: 08/11/2012  Substance and Sexual Activity   Alcohol use: No   Drug use: No   Sexual activity: Not on file  Other Topics Concern   Not on file  Social History Narrative   Not on file   Social Determinants of Health   Financial Resource Strain: Not on file  Food Insecurity: Not on file  Transportation Needs: Not on file  Physical Activity: Not on file  Stress: Not on file  Social Connections: Not on file  Intimate Partner Violence: Not on file      Marcial Pacas, M.D. Ph.D.  [facility] [address] [place], [place] [address] Ph: [phone] Fax:  [phone]  CC:  Shirline Frees, MD [address] [place],  [place] [address]  Shirline Frees, MD

## 2022-05-02 DIAGNOSIS — L820 Inflamed seborrheic keratosis: Secondary | ICD-10-CM | POA: Diagnosis not present

## 2022-05-11 ENCOUNTER — Encounter: Payer: Self-pay | Admitting: Neurology

## 2022-05-11 ENCOUNTER — Ambulatory Visit: Payer: Medicare HMO | Admitting: Neurology

## 2022-05-11 VITALS — BP 144/77 | HR 72 | Ht 74.5 in | Wt 264.6 lb

## 2022-05-11 DIAGNOSIS — Z8673 Personal history of transient ischemic attack (TIA), and cerebral infarction without residual deficits: Secondary | ICD-10-CM

## 2022-05-11 DIAGNOSIS — Z789 Other specified health status: Secondary | ICD-10-CM

## 2022-05-11 DIAGNOSIS — R0683 Snoring: Secondary | ICD-10-CM

## 2022-05-11 DIAGNOSIS — R351 Nocturia: Secondary | ICD-10-CM

## 2022-05-11 DIAGNOSIS — G4719 Other hypersomnia: Secondary | ICD-10-CM | POA: Diagnosis not present

## 2022-05-11 DIAGNOSIS — E669 Obesity, unspecified: Secondary | ICD-10-CM | POA: Diagnosis not present

## 2022-05-11 DIAGNOSIS — R69 Illness, unspecified: Secondary | ICD-10-CM | POA: Diagnosis not present

## 2022-05-11 DIAGNOSIS — R450 Nervousness: Secondary | ICD-10-CM

## 2022-05-11 DIAGNOSIS — R0681 Apnea, not elsewhere classified: Secondary | ICD-10-CM

## 2022-05-11 NOTE — Patient Instructions (Signed)

## 2022-05-11 NOTE — Progress Notes (Signed)
Subjective:    Patient ID: John Mitchell is a 67 y.o. male.  HPI    Star Age, MD, PhD [facility] [address] [place], [place] [address]  Dear Aliene Beams,  I saw your patient, John Mitchell, upon your kind request in my sleep clinic today for initial consultation of his sleep disorder, in particular, concern for underlying obstructive sleep apnea.  The patient is accompanied by by his wife today.  As you know, John Mitchell is a 67 year old male with an underlying medical history of transient vision loss, concern for TIA, hypothyroidism, peripheral vascular disease, osteoarthritis, allergic rhinitis, anxiety and obesity, who reports snoring and excessive daytime somnolence as well as witnessed apneas per wife's report.  His Epworth sleepiness score is 8 out of 24, fatigue severity score is 24 out of 63.  I reviewed your office note from 04/18/2022. His wife reports that since his TIA episode he has been more nervous.  He does not keep a very set schedule for his bedtime or rise time.  He may go to bed somewhere between 11:30 PM and 1 AM and rise time is generally between 830 and 10 AM.  He has nocturia about once per average night.  He does have to get up once or twice at night also to let the dog out, they have an older dog in the household.  He is retired from [employer].  He quit smoking some 26 years ago.  They have a 30 year old son, he also has 2 other children from before, 66 year old daughter and 89 year old son.  He quit drinking alcohol some 30+ years ago.  He drinks quite a bit of caffeine in the form of coffee, 2 to 4 cups/day, some decaf tea, and [employer], 4 to 6 cans/day, diet.  He denies recurrent nocturnal or morning headaches.  He typically sleeps on his left side.  He has a history of tooth grinding and uses an over-the-counter bite guard but not consistently.  His Past Medical History Is Significant For: Past Medical History:   Diagnosis Date   Acquired hypothyroidism    Allergic rhinitis    Anxiety    Peripheral vascular disease ([place])    Primary osteoarthritis    Vasomotor rhinitis     His Past Surgical History Is Significant For: Past Surgical History:  Procedure Laterality Date   HAND SURGERY  09/2013   TONSILLECTOMY     VASECTOMY      His Family History Is Significant For: Family History  Problem Relation Age of Onset   Cancer Mother        Ovarian cancer   Heart attack Father    Dementia Father    Heart attack Brother    Asthma Son     His Social History Is Significant For: Social History   Socioeconomic History   Marital status: Married    Spouse name: Not on file   Number of children: Not on file   Years of education: Not on file   Highest education level: Not on file  Occupational History   Not on file  Tobacco Use   Smoking status: Former   Smokeless tobacco: Former    Types: Chew    Quit date: 08/11/2012  Substance and Sexual Activity   Alcohol use: No   Drug use: No   Sexual activity: Not on file  Other Topics Concern   Not on file  Social History Narrative   Not on file  Social Determinants of Health   Financial Resource Strain: Not on file  Food Insecurity: Not on file  Transportation Needs: Not on file  Physical Activity: Not on file  Stress: Not on file  Social Connections: Not on file    His Allergies Are:  No Known Allergies:   His Current Medications Are:  Outpatient Encounter Medications as of 05/11/2022  Medication Sig   Cholecalciferol (VITAMIN D PO) Take by mouth.   citalopram (CELEXA) 40 MG tablet Take 40 mg by mouth daily.   ipratropium (ATROVENT) 0.06 % nasal spray Can use two sprays in each nostril up to four times a day as needed.   Loratadine (CLARITIN PO) Take 1 tablet by mouth as needed.   Multiple Vitamin (MULTIVITAMIN WITH MINERALS) TABS tablet Take 1 tablet by mouth daily.   levocetirizine (XYZAL) 5 MG tablet Take 5 mg by mouth every  evening. (Patient not taking: Reported on 05/11/2022)   No facility-administered encounter medications on file as of 05/11/2022.  :   Review of Systems:  Out of a complete 14 point review of systems, all are reviewed and negative with the exception of these symptoms as listed below:  Review of Systems  Neurological:        Internal - YAN / snores, excessive daytime sleepiness, fatigue.  ESS  8  FSS 24    Objective:  Neurological Exam  Physical Exam Physical Examination:   Vitals:   05/11/22 1113  BP: (!) 144/77  Pulse: 72    General Examination: The patient is a very pleasant 67 y.o. male in no acute distress. He appears well-developed and well-nourished and well groomed.   HEENT: Normocephalic, atraumatic, pupils are equal, round and reactive to light, extraocular tracking is good without limitation to gaze excursion or nystagmus noted. Hearing is grossly intact.  Full beard noted.  Face is symmetric with normal facial animation. Speech is clear with no dysarthria noted. There is no hypophonia. There is no lip, neck/head, jaw or voice tremor. Neck is supple with full range of passive and active motion. There are no carotid bruits on auscultation. Oropharynx exam reveals: mild mouth dryness, adequate dental hygiene and moderate airway crowding, due to redundant soft palate and wider uvula, Mallampati class II, tonsils absent.  Neck circumference 19-1/8 inches.  Minimal overbite.  Tongue protrudes centrally and palate elevates symmetrically.  Chest: Clear to auscultation without wheezing, rhonchi or crackles noted.  Heart: S1+S2+0, regular and normal without murmurs, rubs or gallops noted.   Abdomen: Soft, non-tender and non-distended.  Extremities: There is no pitting edema in the distal lower extremities bilaterally.   Skin: Warm and dry without trophic changes noted.   Musculoskeletal: exam reveals no obvious joint deformities.   Neurologically:  Mental status: The patient  is awake, alert and oriented in all 4 spheres. His immediate and remote memory, attention, language skills and fund of knowledge are appropriate. There is no evidence of aphasia, agnosia, apraxia or anomia. Speech is clear with normal prosody and enunciation. Thought process is linear. Mood is normal and affect is normal.  Cranial nerves II - XII are as described above under HEENT exam.  Motor exam: Normal bulk, strength and tone is noted. There is no obvious action or resting tremor.  Fine motor skills and coordination: grossly intact.  Cerebellar testing: No dysmetria or intention tremor. There is no truncal or gait ataxia.  Sensory exam: intact to light touch in the upper and lower extremities.  Gait, station and balance: He  stands easily. No veering to one side is noted. No leaning to one side is noted. Posture is age-appropriate and stance is narrow based. Gait shows normal stride length and normal pace. No problems turning are noted.   Assessment and Plan:   In summary, John Mitchell is a very pleasant 68 y.o.-year old male with an underlying medical history of transient vision loss, concern for TIA, hypothyroidism, peripheral vascular disease, osteoarthritis, allergic rhinitis, anxiety and obesity, whose history and physical exam are concerning for sleep disordered breathing, particularly obstructive sleep apnea (OSA). A laboratory attended sleep study is typically considered "gold standard" for evaluation of sleep disordered breathing.  The patient initially indicated preference to pursue a home sleep test but is agreeable to pursuing a laboratory attended sleep study as per wife's request. I had a long chat with the patient and his wife about my findings and the diagnosis of sleep apnea, particularly OSA, its prognosis and treatment options. We talked about medical/conservative treatments, surgical interventions and non-pharmacological approaches for symptom control. I explained, in  particular, the risks and ramifications of untreated moderate to severe OSA, especially with respect to developing cardiovascular disease down the road, including congestive heart failure (CHF), difficult to treat hypertension, cardiac arrhythmias (particularly A-fib), neurovascular complications including TIA, stroke and dementia. Even type 2 diabetes has, in part, been linked to untreated OSA. Symptoms of untreated OSA may include (but may not be limited to) daytime sleepiness, nocturia (i.e. frequent nighttime urination), memory problems, mood irritability and suboptimally controlled or worsening mood disorder such as depression and/or anxiety, lack of energy, lack of motivation, physical discomfort, as well as recurrent headaches, especially morning or nocturnal headaches. We talked about the importance of maintaining a healthy lifestyle and striving for healthy weight.  In addition, we talked about the importance of striving for and maintaining good sleep hygiene. I recommended a sleep study at this time. I outlined the differences between a laboratory attended sleep study which is considered more comprehensive and accurate over the option of a home sleep test (HST); the latter may lead to underestimation of sleep disordered breathing in some instances and does not help with diagnosing upper airway resistance syndrome and is not accurate enough to diagnose primary central sleep apnea typically. I outlined possible surgical and non-surgical treatment options of OSA, including the use of a positive airway pressure (PAP) device (i.e. CPAP, AutoPAP/APAP or BiPAP in certain circumstances), a custom-made dental device (aka oral appliance, which would require a referral to a specialist dentist or orthodontist typically, and is generally speaking not considered for patients with full dentures or edentulous state), upper airway surgical options, such as traditional UPPP (which is not considered a first-line treatment)  or the Inspire device (hypoglossal nerve stimulator, which would involve a referral for consultation with an ENT surgeon, after careful selection, following inclusion criteria - also not first-line treatment). I explained the PAP treatment option to the patient in detail, as this is generally considered first-line treatment.  The patient indicated that he would be very little reluctant to try PAP therapy.    We will pick up our discussion about the next steps and treatment options after testing.  We will keep them posted as to the test results by phone call and/or MyChart messaging where possible.  We will plan to follow-up in sleep clinic accordingly as well.  I answered all their questions today and the patient and his wife were in agreement.   I encouraged them to call with any  interim questions, concerns, problems or updates or email [place] through [facility].  Generally speaking, sleep test authorizations may take up to 2 weeks, sometimes less, sometimes longer, the patient is encouraged to get in touch with [place] if they do not hear back from the sleep lab staff directly within the next 2 weeks.  Thank you very much for allowing me to participate in the care of this nice patient. If I can be of any further assistance to you please do not hesitate to call me at [phone].  Sincerely,   Star Age, MD, PhD

## 2022-05-23 ENCOUNTER — Telehealth: Payer: Self-pay | Admitting: Neurology

## 2022-05-23 NOTE — Telephone Encounter (Signed)
[employer] medicare pending uploaded notes on the portal  

## 2022-05-24 ENCOUNTER — Ambulatory Visit (HOSPITAL_COMMUNITY): Payer: Medicare HMO | Attending: Neurology

## 2022-05-24 DIAGNOSIS — G459 Transient cerebral ischemic attack, unspecified: Secondary | ICD-10-CM

## 2022-05-24 DIAGNOSIS — G4710 Hypersomnia, unspecified: Secondary | ICD-10-CM | POA: Diagnosis not present

## 2022-05-24 LAB — ECHOCARDIOGRAM COMPLETE
Area-P 1/2: 2.85 cm2
S' Lateral: 3.3 cm

## 2022-05-26 NOTE — Telephone Encounter (Signed)
[facility]- [employer] medicare Berkley Harvey: [id number] (exp. 05/23/22 to 11/21/22)

## 2022-06-08 ENCOUNTER — Telehealth: Payer: Self-pay

## 2022-06-08 NOTE — Telephone Encounter (Signed)
Please see previous phone note from 05/23/22.

## 2022-06-08 NOTE — Telephone Encounter (Signed)
John Mitchell, NT2 minutes ago (3:40 PM)   Pt confirmed 8pm 06/29/22 [facility] appt is okay. Let pt know a packet with detailed information and instructions would be mailed to him. Gave him Emily's direct # [phone]) in case he needsto reschedule or cancel appt      Patient is scheduled at [facility] for 07/03/22 at 8 pm.  Mailed packet to the patient.  [employer] Berkley Harvey: [id number] (exp. 05/23/22 to 11/21/22)  Mailed packet to the patient. And sent mychart message.

## 2022-06-08 NOTE — Telephone Encounter (Signed)
Pt confirmed 8pm 06/29/22 [facility] appt is okay. Let pt know a packet with detailed information and instructions would be mailed to him. Gave him Emily's direct # [phone]) in case he needs to reschedule or cancel appt

## 2022-06-17 DIAGNOSIS — R051 Acute cough: Secondary | ICD-10-CM | POA: Diagnosis not present

## 2022-06-17 DIAGNOSIS — Z03818 Encounter for observation for suspected exposure to other biological agents ruled out: Secondary | ICD-10-CM | POA: Diagnosis not present

## 2022-06-27 NOTE — Telephone Encounter (Signed)
Patient called and needed to r/s his 07/03/22 he is r/s for 09/05/22 at 9 pm.  Mailed him a new packet to the patient.

## 2022-06-30 DIAGNOSIS — E7800 Pure hypercholesterolemia, unspecified: Secondary | ICD-10-CM | POA: Diagnosis not present

## 2022-06-30 DIAGNOSIS — Z0000 Encounter for general adult medical examination without abnormal findings: Secondary | ICD-10-CM | POA: Diagnosis not present

## 2022-06-30 DIAGNOSIS — F419 Anxiety disorder, unspecified: Secondary | ICD-10-CM | POA: Diagnosis not present

## 2022-06-30 DIAGNOSIS — Z125 Encounter for screening for malignant neoplasm of prostate: Secondary | ICD-10-CM | POA: Diagnosis not present

## 2022-06-30 DIAGNOSIS — J301 Allergic rhinitis due to pollen: Secondary | ICD-10-CM | POA: Diagnosis not present

## 2022-06-30 DIAGNOSIS — Z1211 Encounter for screening for malignant neoplasm of colon: Secondary | ICD-10-CM | POA: Diagnosis not present

## 2022-08-19 DIAGNOSIS — H8112 Benign paroxysmal vertigo, left ear: Secondary | ICD-10-CM | POA: Diagnosis not present

## 2022-09-02 DIAGNOSIS — Z008 Encounter for other general examination: Secondary | ICD-10-CM | POA: Diagnosis not present

## 2022-09-02 DIAGNOSIS — R269 Unspecified abnormalities of gait and mobility: Secondary | ICD-10-CM | POA: Diagnosis not present

## 2022-09-02 DIAGNOSIS — Z833 Family history of diabetes mellitus: Secondary | ICD-10-CM | POA: Diagnosis not present

## 2022-09-02 DIAGNOSIS — J301 Allergic rhinitis due to pollen: Secondary | ICD-10-CM | POA: Diagnosis not present

## 2022-09-02 DIAGNOSIS — E785 Hyperlipidemia, unspecified: Secondary | ICD-10-CM | POA: Diagnosis not present

## 2022-09-02 DIAGNOSIS — I10 Essential (primary) hypertension: Secondary | ICD-10-CM | POA: Diagnosis not present

## 2022-09-02 DIAGNOSIS — E669 Obesity, unspecified: Secondary | ICD-10-CM | POA: Diagnosis not present

## 2022-09-02 DIAGNOSIS — Z809 Family history of malignant neoplasm, unspecified: Secondary | ICD-10-CM | POA: Diagnosis not present

## 2022-09-02 DIAGNOSIS — Z6833 Body mass index (BMI) 33.0-33.9, adult: Secondary | ICD-10-CM | POA: Diagnosis not present

## 2022-09-02 DIAGNOSIS — Z8673 Personal history of transient ischemic attack (TIA), and cerebral infarction without residual deficits: Secondary | ICD-10-CM | POA: Diagnosis not present

## 2022-09-02 DIAGNOSIS — Z87891 Personal history of nicotine dependence: Secondary | ICD-10-CM | POA: Diagnosis not present

## 2022-09-05 ENCOUNTER — Ambulatory Visit (INDEPENDENT_AMBULATORY_CARE_PROVIDER_SITE_OTHER): Payer: Medicare HMO | Admitting: Neurology

## 2022-09-05 DIAGNOSIS — R0683 Snoring: Secondary | ICD-10-CM

## 2022-09-05 DIAGNOSIS — H43811 Vitreous degeneration, right eye: Secondary | ICD-10-CM | POA: Diagnosis not present

## 2022-09-05 DIAGNOSIS — E669 Obesity, unspecified: Secondary | ICD-10-CM

## 2022-09-05 DIAGNOSIS — G4720 Circadian rhythm sleep disorder, unspecified type: Secondary | ICD-10-CM

## 2022-09-05 DIAGNOSIS — R0681 Apnea, not elsewhere classified: Secondary | ICD-10-CM

## 2022-09-05 DIAGNOSIS — Z8673 Personal history of transient ischemic attack (TIA), and cerebral infarction without residual deficits: Secondary | ICD-10-CM

## 2022-09-05 DIAGNOSIS — G4719 Other hypersomnia: Secondary | ICD-10-CM

## 2022-09-05 DIAGNOSIS — G4733 Obstructive sleep apnea (adult) (pediatric): Secondary | ICD-10-CM

## 2022-09-05 DIAGNOSIS — R450 Nervousness: Secondary | ICD-10-CM

## 2022-09-05 DIAGNOSIS — H5203 Hypermetropia, bilateral: Secondary | ICD-10-CM | POA: Diagnosis not present

## 2022-09-05 DIAGNOSIS — G4734 Idiopathic sleep related nonobstructive alveolar hypoventilation: Secondary | ICD-10-CM

## 2022-09-05 DIAGNOSIS — G4761 Periodic limb movement disorder: Secondary | ICD-10-CM

## 2022-09-05 DIAGNOSIS — H35371 Puckering of macula, right eye: Secondary | ICD-10-CM | POA: Diagnosis not present

## 2022-09-05 DIAGNOSIS — Z789 Other specified health status: Secondary | ICD-10-CM

## 2022-09-05 DIAGNOSIS — R351 Nocturia: Secondary | ICD-10-CM

## 2022-09-07 NOTE — Addendum Note (Signed)
Addended by: Huston Foley on: 09/07/2022 04:39 PM   Modules accepted: Orders

## 2022-09-07 NOTE — Procedures (Signed)
Physician Interpretation:     [facility] POLYSOMNOGRAPHY  INTERPRETATION REPORT   STUDY DATE:  09/05/2022     PATIENT NAME:  John Mitchell         DATE OF BIRTH:  07-29-1955  PATIENT ID:  [id number]    TYPE OF STUDY:  PSG  READING PHYSICIAN: Huston Foley, MD, PhD   SCORING TECHNICIAN: Domingo Cocking, RPSGT  Referred by: Dr. Levert Feinstein ? History and Indication for Testing: 67 year old male with an underlying medical history of transient vision loss, concern for TIA, hypothyroidism, peripheral vascular disease, osteoarthritis, allergic rhinitis, anxiety and obesity, who reports snoring and excessive daytime somnolence as well as witnessed apneas. His Epworth sleepiness score is 8 out of 24, fatigue severity score is 24 out of 63. Height: 75 in Weight: 264 lb (BMI 33) Neck Size: 19 in    MEDICATIONS: Vitamin D, Celexa, Atrovent, Claritin, Multivitamins w/minerals, Xyzal.   TECHNICAL DESCRIPTION: A registered sleep technologist was in attendance for the duration of the recording.  Data collection, scoring, video monitoring, and reporting were performed in compliance with the AASM Manual for the Scoring of Sleep and Associated Events; (Hypopnea is scored based on the criteria listed in Section VIII D. 1b in the AASM Manual V2.6 using a 4% oxygen desaturation rule or Hypopnea is scored based on the criteria listed in Section VIII D. 1a in the AASM Manual V2.6 using 3% oxygen desaturation and /or arousal rule).  SLEEP CONTINUITY AND SLEEP ARCHITECTURE:  Lights-out was at 22:05: and lights-on at  05:08:, with a total recording time of 7 hours, 3 min. Total sleep time ( TST) was 240.0 minutes with a decreased sleep efficiency at 56.7%. There was  18.5% REM sleep.    BODY POSITION:  TST was divided  between the following sleep positions: 75.6% supine;  24.4% lateral;  0% prone. Duration of total sleep and percent of total sleep in their respective position is as  follows: supine 181 minutes (76%), non-supine 59 minutes (24%); right 58 minutes (24%), left 00 minutes (0%), and prone 00 minutes (0%).  Total supine REM sleep time was 44 minutes (100% of total REM sleep).  Sleep latency was increased at 129.0 minutes.  REM sleep latency was markedly increased at 242.0 minutes. Of the total sleep time, the percentage of stage N1 sleep was 11.0%, which is increased, stage N2 sleep was 70%, which is markedly increased, stage N3 sleep was absent, and REM sleep was 18.5%, which is mildly reduced. Wake after sleep onset (WASO) time accounted for 54 minutes with one longer period of wakefulness and otherwise no significant sleep fragmentation.   RESPIRATORY MONITORING:   Based on CMS criteria (using a 4% oxygen desaturation rule for scoring hypopneas), there were 193 apneas (155 obstructive; 36 central; 2 mixed), and 48 hypopneas.  Apnea index was 48.3. Hypopnea index was 12.0. The apnea-hypopnea index was 60.3/hour overall (76.4 supine, 0 non-supine; 76.9 REM, 76.9 supine REM).  There were 0 respiratory effort-related arousals (RERAs).  The RERA index was 0 events/h. Total respiratory disturbance index (RDI) was 60.3 events/h. RDI results showed: supine RDI  76.4 /h; non-supine RDI 10.3 /h; REM RDI 76.9 /h, supine REM RDI 76.9 /h.   Based on AASM criteria (using a 3% oxygen desaturation and /or arousal rule for scoring hypopneas), there were 193 apneas (155 obstructive; 36 central; 2 mixed), and 81 hypopneas. Apnea index was 48.3. Hypopnea index was 20.3. The apnea-hypopnea index was 68.5 overall (86.3 supine,  0 non-supine; 79.6 REM, 79.6 supine REM).  There were 0 respiratory effort-related arousals (RERAs).  The RERA index was 0 events/h. Total respiratory disturbance index (RDI) was 68.5 events/h. RDI results showed: supine RDI  86.3 /h; non-supine RDI 13.3 /h; REM RDI 79.6 /h, supine REM RDI 79.6 /h.  OXIMETRY: Oxyhemoglobin Saturation Nadir during sleep was at  77%  (during supine REM sleep) from a mean of 93%.  Of the Total sleep time (TST)   hypoxemia (=<88%) was present for  19.2 minutes, or 8.0% of total sleep time.  LIMB MOVEMENTS: There were 76 periodic limb movements of sleep (19.0/hr), of which 1 (0.3/hr) were associated with an arousal.   AROUSAL: There were 102 arousals in total, for an arousal index of 26 arousals/hour.  Of these, 62 were identified as respiratory-related arousals (16 /h), 1 were PLM-related arousals (0 /h), and 47 were non-specific arousals (12 /h).   EEG: Review of the EEG showed no abnormal electrical discharges and symmetrical bihemispheric findings.    EKG: The EKG revealed normal sinus rhythm (NSR). The average heart rate during sleep was 64 bpm.   AUDIO/VIDEO REVIEW: The audio and video review did not show any abnormal or unusual behaviors, movements, phonations or vocalizations. The patient took no restroom breaks. Snoring was noted, mostly in the mild range.   POST-STUDY QUESTIONNAIRE: Post study, the patient indicated, that sleep was worse than usual.   IMPRESSION:  1. Severe Obstructive Sleep Apnea (OSA), with nocturnal hypoxemia 2. PLMD (periodic limb movement disorder) 3. Dysfunctions associated with sleep stages or arousal from sleep  RECOMMENDATIONS:  1. This study demonstrates severe obstructive sleep apnea, with a total AHI of 60.3/hour, REM AHI of 76.9/hour, supine AHI of 76.4/hour and O2 nadir of 77% (during supine REM sleep) and significant time below or at 88% saturation of 19 minutes, indicating nocturnal hypoxemia. Treatment with a positive airway pressure (PAP) device is recommended.  The patient will be advised to proceed with home autoPAP therapy for now.  A laboratory attended, full night PAP-titration study can be considered down the road, to optimize therapy settings, mask fit, monitoring of tolerance and of proper oxygen saturations. Other treatment options may be limited, and may include (generally  speaking) surgical options in selected patients.  Concomitant weight loss is recommended. Please note that untreated obstructive sleep apnea may carry additional perioperative morbidity. Patients with significant obstructive sleep apnea should receive perioperative PAP therapy and the surgeons and particularly the anesthesiologist should be informed of the diagnosis and the severity of the sleep disordered breathing. 2. This study shows some sleep fragmentation, decreased sleep efficiency, delay in sleep onset and abnormal sleep stage percentages; these are nonspecific findings and per se do not signify an intrinsic sleep disorder or a cause for the patient's sleep-related symptoms. Causes include (but are not limited to) the first night effect of the sleep study, circadian rhythm disturbances, medication effect or an underlying mood disorder or medical problem.  3. Mild PLMs (periodic limb movements of sleep) were noted during this study without any significant arousals; clinical correlation is recommended. Medication effect from the antidepressant medication should be considered.  PLMs may improve with OSA treatment. 4. The patient should be cautioned not to drive, work at heights, or operate dangerous or heavy equipment when tired or sleepy. Review and reiteration of good sleep hygiene measures should be pursued with any patient. 5. The patient will be seen in follow-up in the sleep clinic at [facility] for discussion of the test  results, symptom and treatment compliance review, further management strategies, etc. The patient and the referring provider will be notified of the test results.   I certify that I have reviewed the entire raw data recording prior to the issuance of this report in accordance with the Standards of Accreditation of the American Academy of Sleep Medicine (AASM).  Huston Foley, MD, PhD Medical Director, [facility]) Diplomat, ABPN (Neurology and  Sleep)               Technical Report:   General Information  Name: Righteous, Claiborne BMI: 33.44 Physician: Huston Foley, MD  ID: [id number] Height: 74.5 in Technician: Domingo Cocking, RPSGT  Sex: Male Weight: 264.0 lb Record: [id number]  Age: 44 [1955-04-14] Date: 09/05/2022    Medical & Medication History    Mr. Trombetta is a 67 year old male with an underlying medical history of transient vision loss, concern for TIA, hypothyroidism, peripheral vascular disease, osteoarthritis, allergic rhinitis, anxiety and obesity, who reports snoring and excessive daytime somnolence as well as witnessed apneas per wife's report. His Epworth sleepiness score is 8 out of 24, fatigue severity score is 24 out of 63.  Vitamin D, Celexa, Atrovent, Claritin, Multivitamins w/minerals, Xyzal   Sleep Disorder      Comments   Patient arrived for a diagnostic polysomnogram. Procedure explained and all questions answered. Standard paste setup without complications. Significantly delayed sleep onset at just over two hours. Patient slept supine, and right. Occasional mild snoring was heard. Frequent respiratory events observed. No obvious cardiac arrhythmias noted. No significant PLMS observed. No nocturia.    Lights out: 10:05:27 PM Lights on: 05:08:20 AM   Time Total Supine Side Prone Upright  Recording (TRT) 7h 3.19m 4h 47.5m 2h 16.35m 0h 0.36m 0h 0.79m  Sleep (TST) 4h 0.65m 3h 1.3m 0h 58.2m 0h 0.58m 0h 0.31m   Latency N1 N2 N3 REM Onset Per. Slp. Eff.  Actual 0h 0.55m 0h 22.20m 0h 0.62m 4h 2.96m 2h 9.22m 2h 39.19m 56.74%   Stg Dur Wake N1 N2 N3 REM  Total 183.0 26.5 169.0 0.0 44.5  Supine 105.5 12.5 124.5 0.0 44.5  Side 77.5 14.0 44.5 0.0 0.0  Prone 0.0 0.0 0.0 0.0 0.0  Upright 0.0 0.0 0.0 0.0 0.0   Stg % Wake N1 N2 N3 REM  Total 43.3 11.0 70.4 0.0 18.5  Supine 24.9 5.2 51.9 0.0 18.5  Side 18.3 5.8 18.5 0.0 0.0  Prone 0.0 0.0 0.0 0.0 0.0  Upright 0.0 0.0 0.0 0.0 0.0     Apnea Summary Sub Supine Side  Prone Upright  Total 193 Total 193 184 9 0 0    REM 50 50 0 0 0    NREM 143 134 9 0 0  Obs 155 REM 50 50 0 0 0    NREM 105 105 0 0 0  Mix 2 REM 0 0 0 0 0    NREM 2 2 0 0 0  Cen 36 REM 0 0 0 0 0    NREM 36 27 9 0 0   Rera Summary Sub Supine Side Prone Upright  Total 0 Total 0 0 0 0 0    REM 0 0 0 0 0    NREM 0 0 0 0 0   Hypopnea Summary Sub Supine Side Prone Upright  Total 81 Total 81 77 4 0 0    REM 9 9 0 0 0    NREM 72 68 4 0 0  4% Hypopnea Summary Sub Supine Side Prone Upright  Total (4%) 48 Total 48 47 1 0 0    REM 7 7 0 0 0    NREM 41 40 1 0 0     AHI Total Obs Mix Cen  68.50 Apnea 48.25 38.75 0.50 9.00   Hypopnea 20.25 -- -- --  60.25 Hypopnea (4%) 12.00 -- -- --    Total Supine Side Prone Upright  Position AHI 68.50 86.28 13.33 0.00 0.00  REM AHI 79.55   NREM AHI 65.98   Position RDI 68.50 86.28 13.33 0.00 0.00  REM RDI 79.55   NREM RDI 65.98    4% Hypopnea Total Supine Side Prone Upright  Position AHI (4%) 60.25 76.36 10.26 0.00 0.00  REM AHI (4%) 76.85   NREM AHI (4%) 56.47   Position RDI (4%) 60.25 76.36 10.26 0.00 0.00  REM RDI (4%) 76.85   NREM RDI (4%) 56.47    Desaturation Information Threshold: 2% <100% <90% <80% <70% <60% <50% <40%  Supine 274.0 48.0 1.0 0.0 0.0 0.0 0.0  Side 46.0 5.0 0.0 0.0 0.0 0.0 0.0  Prone 0.0 0.0 0.0 0.0 0.0 0.0 0.0  Upright 0.0 0.0 0.0 0.0 0.0 0.0 0.0  Total 320.0 53.0 1.0 0.0 0.0 0.0 0.0  Index 65.3 10.8 0.2 0.0 0.0 0.0 0.0   Threshold: 3% <100% <90% <80% <70% <60% <50% <40%  Supine 217.0 47.0 1.0 0.0 0.0 0.0 0.0  Side 13.0 5.0 0.0 0.0 0.0 0.0 0.0  Prone 0.0 0.0 0.0 0.0 0.0 0.0 0.0  Upright 0.0 0.0 0.0 0.0 0.0 0.0 0.0  Total 230.0 52.0 1.0 0.0 0.0 0.0 0.0  Index 46.9 10.6 0.2 0.0 0.0 0.0 0.0   Threshold: 4% <100% <90% <80% <70% <60% <50% <40%  Supine 137.0 45.0 1.0 0.0 0.0 0.0 0.0  Side 5.0 5.0 0.0 0.0 0.0 0.0 0.0  Prone 0.0 0.0 0.0 0.0 0.0 0.0 0.0  Upright 0.0 0.0 0.0 0.0 0.0 0.0 0.0  Total 142.0 50.0 1.0  0.0 0.0 0.0 0.0  Index 29.0 10.2 0.2 0.0 0.0 0.0 0.0   Threshold: 3% <100% <90% <80% <70% <60% <50% <40%  Supine 217 47 1 0 0 0 0  Side 13 5 0 0 0 0 0  Prone 0 0 0 0 0 0 0  Upright 0 0 0 0 0 0 0  Total 230 52 1 0 0 0 0   Awakening/Arousal Information # of Awakenings 13  Wake after sleep onset 54.69m  Wake after persistent sleep 40.46m   Arousal Assoc. Arousals Index  Apneas 47 11.8  Hypopneas 15 3.8  Leg Movements 3 0.8  Snore 0 0.0  PTT Arousals 0 0.0  Spontaneous 47 11.8  Total 109 27.3  Leg Movement Information PLMS LMs Index  Total LMs during PLMS 76 19.0  LMs w/ Microarousals 1 0.3   LM LMs Index  w/ Microarousal 2 0.5  w/ Awakening 0 0.0  w/ Resp Event 2 0.5  Spontaneous 6 1.5  Total 10 2.5     Desaturation threshold setting: 3% Minimum desaturation setting: 10 seconds SaO2 nadir: 77% The longest event was a 47 sec obstructive Apnea with a minimum SaO2 of 84%. The lowest SaO2 was 50% associated with a 17 sec central Apnea. EKG Rates EKG Avg Max Min  Awake 67 92 57  Asleep 64 81 54  EKG Events: Tachycardia

## 2022-09-08 ENCOUNTER — Telehealth: Payer: Self-pay | Admitting: *Deleted

## 2022-09-08 DIAGNOSIS — Z0100 Encounter for examination of eyes and vision without abnormal findings: Secondary | ICD-10-CM | POA: Diagnosis not present

## 2022-09-08 NOTE — Telephone Encounter (Signed)
Spoke to [facility] gave sleep study results Pt chose [facility] has DME . Pt aware he is a urgent set up Pt aware of insurance compliance Gave pt [facility] # Faxed over orders to [facility] this afternoon and sent results to referring MD. Made f/u with Jessica,NP in Oct 2024 Pt expressed understanding and thanked me for calling

## 2022-09-08 NOTE — Telephone Encounter (Signed)
-----   Message from Huston Foley sent at 09/07/2022  4:39 PM EDT ----- Urgent set up requested on PAP therapy, due to severe OSA. Patient referred by Dr. Terrace Arabia, seen by me on 05/11/22, diagnostic PSG on 09/05/22.    Please call and notify the patient that the recent sleep study showed severe obstructive sleep apnea. I recommend treatment for in the form of autoPAP. We may consider at a CPAP titration study at a later date, if need be, which means, that we would ask him to come back in for a second sleep study with CPAP treatment. For now, I would like to start him on a so-called autoPAP machine at home, through a DME company (of his choice, or as per insurance requirement). The DME representative will educate him on how to use the machine, how to put the mask on, etc. I have placed an order in the chart. Please send referral, talk to patient, send report to referring MD. We will need a FU in sleep clinic for 10 weeks post-PAP set up, please arrange that with me or one of our NPs. Thanks,   Huston Foley, MD, PhD [facility])

## 2022-09-14 ENCOUNTER — Encounter: Payer: Self-pay | Admitting: Physical Therapy

## 2022-09-14 ENCOUNTER — Ambulatory Visit: Payer: Medicare HMO | Attending: Family Medicine | Admitting: Physical Therapy

## 2022-09-14 VITALS — BP 132/85 | HR 74

## 2022-09-14 DIAGNOSIS — H8112 Benign paroxysmal vertigo, left ear: Secondary | ICD-10-CM | POA: Insufficient documentation

## 2022-09-14 DIAGNOSIS — R42 Dizziness and giddiness: Secondary | ICD-10-CM | POA: Insufficient documentation

## 2022-09-14 NOTE — Therapy (Signed)
OUTPATIENT PHYSICAL THERAPY VESTIBULAR EVALUATION     Patient Name: John Mitchell MRN: [id number] DOB:11-25-1955, 67 y.o., male Today's Date: 09/14/2022  END OF SESSION:  PT End of Session - 09/14/22 0936     Visit Number 1    Number of Visits 5    Date for PT Re-Evaluation 10/14/22    Authorization Type [employer]    PT Start Time [phone]    PT Stop Time 1014    PT Time Calculation (min) 40 min    Activity Tolerance Patient tolerated treatment well    Behavior During Therapy [facility] for tasks assessed/performed             Past Medical History:  Diagnosis Date   Acquired hypothyroidism    Allergic rhinitis    Anxiety    Peripheral vascular disease (HCC)    Primary osteoarthritis    Vasomotor rhinitis    Past Surgical History:  Procedure Laterality Date   HAND SURGERY  09/2013   TONSILLECTOMY     VASECTOMY     Patient Active Problem List   Diagnosis Date Noted   TIA (transient ischemic attack) 04/18/2022   Excessive sleepiness 04/18/2022   Plantar fasciitis 05/11/2018   Atheroembolism of upper extremity, left (HCC) 04/21/2018   Carpal tunnel syndrome, left 04/21/2018   Ex-smoker 04/21/2018   Thrombosis of left ulnar artery (HCC) 04/21/2018   Ulnar nerve 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, left 04/21/2018   Chronic non-seasonal allergic rhinitis 05/09/2016   Sensorineural hearing loss (SNHL) of right ear with restricted hearing of left ear 05/09/2016   Tinnitus of right ear of vascular origin 05/09/2016   Discoloration of skin of finger 03/19/2013    PCP:  Laurann Montana, MD REFERRING PROVIDER:  Laurann Montana, MD  REFERRING DIAG: [phone] (ICD-10-CM) - Benign paroxysmal vertigo, left ear  THERAPY DIAG:  BPPV (benign paroxysmal positional vertigo), left  Dizziness and giddiness  ONSET DATE: 08/26/2022  Rationale for Evaluation and Treatment: Rehabilitation  SUBJECTIVE:   SUBJECTIVE STATEMENT: Pt saw his PCP at the beginning of July for dizziness. Symptoms  started at the beginning of July. Sleeps on the L side, and when to go to the L side, "things just went haywire". Used to feel like an earthquake, now just feels like a tremor. When he gets up in the middle of the night to take his dog out is feeling loopy. Has to have his arm against the wall to balance himself. Feels like he is spinning, like he is on a boat. When getting up in the morning it is not as bad. Takes Meclizine every night. PCP told him about the Epley maneuver, have not tried it at home. When laying back in his recliner and has to suddenly stand up, feels a little bit off balance.   Pt accompanied by: self  PERTINENT HISTORY: PMH: Peripheral vascular disease, TIA, hypothyroidism, HLD   PAIN:  Are you having pain? No  Vitals:   09/14/22 0946  BP: 132/85  Pulse: 74     PRECAUTIONS: None  RED FLAGS: None   WEIGHT BEARING RESTRICTIONS: No  FALLS: Has patient fallen in last 6 months? No  PLOF: Independent  PATIENT GOALS: Wants to get well, wants to get his life back.   OBJECTIVE:   DIAGNOSTIC FINDINGS: MRI brain 04/12/22: IMPRESSION: Normal study for age. No cause of the presenting symptoms is identified. Few punctate foci of T2 and FLAIR signal within the cerebral hemispheric white matter, not likely significant.  04/11/22: RIGHT CAROTID  ARTERY: No significant atherosclerotic plaque or evidence of stenosis in the internal carotid artery.   RIGHT VERTEBRAL ARTERY:  Patent with normal antegrade flow.   LEFT CAROTID ARTERY: No significant atherosclerotic plaque or evidence of stenosis in the internal carotid artery.   LEFT VERTEBRAL ARTERY:  Patent with normal antegrade flow.   IMPRESSION: Negative bilateral carotid duplex ultrasound.    COGNITION: Overall cognitive status: Within functional limits for tasks assessed    PATIENT SURVEYS:  FOTO DPS: 46, DFS: 58.2  VESTIBULAR ASSESSMENT:  GENERAL OBSERVATION: Ambulates in with no AD independently     SYMPTOM BEHAVIOR:  Subjective history: See above.   Non-Vestibular symptoms:  in the beginning was feeling pretty nauseous, that has improved.   Type of dizziness: Imbalance (Disequilibrium) and Spinning/Vertigo  Frequency: Every morning when getting up at 2 or 3 in the morning   Duration: Couple of minutes   Aggravating factors: Induced by position change: rolling to the left and sit to stand and getting up quickly from his recliner   Relieving factors:  giving it time to settle   Progression of symptoms: better  OCULOMOTOR EXAM:  Ocular Alignment: normal  Ocular ROM: No Limitations  Spontaneous Nystagmus: absent  Gaze-Induced Nystagmus: absent  Smooth Pursuits: intact  Saccades: intact   VESTIBULAR - OCULAR REFLEX:   Slow VOR: Normal  VOR Cancellation: Normal  Head-Impulse Test: HIT Right: negative HIT Left: negative No dizziness    POSITIONAL TESTING: Right Dix-Hallpike: no nystagmus Left Dix-Hallpike: upbeating, left nystagmus and rotary, lasting approx. 15-20 seconds    VESTIBULAR TREATMENT:                                                                                                   DATE: 09/14/22  Canalith Repositioning:  Epley Left: Number of Reps: 1, Response to Treatment: symptoms resolved, and Comment: re-assessed after 1 rep with symptoms resolved and no dizziness/nystagmus    PATIENT EDUCATION: Education details: Clinical findings, BPPV education and purpose of Epley maneuver, provided BPPV handout. Discussed resolution of BPPV at end of session. Scheduled a follow-up appt next week just in case, with pt can cancel if needed  Person educated: Patient Education method: Explanation and Handouts Education comprehension: verbalized understanding  HOME EXERCISE PROGRAM: N/A   GOALS: Goals reviewed with patient? Yes  SHORT TERM GOALS: ALL STGS = LTGS   LONG TERM GOALS: Target date: 10/12/2022  Pt will continue to demo negative positional testing  indicating resolution of L posterior canal BPPV.  Baseline:  Goal status: INITIAL  2.  Pt will improve DPS to at least 63 in order to demo improved functional outcomes related to dizziness.   Baseline:  Goal status: INITIAL    ASSESSMENT:  CLINICAL IMPRESSION: Patient is a 67 year old male referred to Neuro OPPT for vertigo.   Pt's PMH is significant for: Peripheral vascular disease, TIA, hypothyroidism, HLD . The following deficits were present during the exam: L upbeating rotary nystagmus lasting approx. 15-20 seconds in L [facility], indicating L posterior canalithiasis. Treated x1 rep of the L Epley with pt demonstrating  resolution afterwards with no dizziness/nystagmus. Pt feeling much better afterwards and when leaving session. Scheduled 1 follow-up visit next week just in case. If BPPV remains resolved, pt can call and cancel.    OBJECTIVE IMPAIRMENTS: decreased balance and dizziness.   ACTIVITY LIMITATIONS: bed mobility and locomotion level  PARTICIPATION LIMITATIONS:  N/A  PERSONAL FACTORS: Past/current experiences and 3+ comorbidities: Peripheral vascular disease, TIA, hypothyroidism, HLD   are also affecting patient's functional outcome.   REHAB POTENTIAL: Excellent  CLINICAL DECISION MAKING: Stable/uncomplicated  EVALUATION COMPLEXITY: Low   PLAN:  PT FREQUENCY: 1-2x/week  PT DURATION: 4 weeks  PLANNED INTERVENTIONS: Therapeutic exercises, Therapeutic activity, Neuromuscular re-education, Balance training, Gait training, Patient/Family education, Self Care, Joint mobilization, Vestibular training, and Canalith repositioning  PLAN FOR NEXT SESSION: re-assess L post canal BPPV if symptoms return or D/C    Jaziah Goeller N Nyemah Watton, PT,DPT 09/14/2022, 10:33 AM

## 2022-09-15 DIAGNOSIS — G4733 Obstructive sleep apnea (adult) (pediatric): Secondary | ICD-10-CM | POA: Diagnosis not present

## 2022-09-20 ENCOUNTER — Ambulatory Visit: Payer: Medicare HMO | Attending: Family Medicine | Admitting: Physical Therapy

## 2022-09-20 ENCOUNTER — Encounter: Payer: Self-pay | Admitting: Physical Therapy

## 2022-09-20 ENCOUNTER — Telehealth: Payer: Self-pay | Admitting: Physical Therapy

## 2022-09-20 NOTE — Therapy (Signed)
[facility] [address] [place], [place], [address] Phone: [phone]   Fax:  [phone]  Patient Details  Name: Leelend Forker MRN: [id number] Date of Birth: 1955/10/22 Referring Provider:  No ref. provider found  Encounter Date: 09/20/2022  Called pt regarding no-show appt today and pt is not having any dizziness anymore and BPPV is resolved. No further appts are needed.  Pt answered FOTO questions via telephone  Pt has met LTGs and will be discharged. DPS: 70    Pt will continue to demo negative positional testing indicating resolution of L posterior canal BPPV.  Baseline:  Goal status: MET   2.  Pt will improve DPS to at least 63 in order to demo improved functional outcomes related to dizziness.    Baseline:  Goal status: MET   Drake Leach, PT, DPT 09/20/2022, 2:24 PM  [facility] [address] [place], [place], [address] Phone: [phone]   Fax:  [phone]

## 2022-10-12 DIAGNOSIS — Z1211 Encounter for screening for malignant neoplasm of colon: Secondary | ICD-10-CM | POA: Diagnosis not present

## 2022-10-16 DIAGNOSIS — G4733 Obstructive sleep apnea (adult) (pediatric): Secondary | ICD-10-CM | POA: Diagnosis not present

## 2022-11-15 DIAGNOSIS — G4733 Obstructive sleep apnea (adult) (pediatric): Secondary | ICD-10-CM | POA: Diagnosis not present

## 2022-11-22 DIAGNOSIS — G4733 Obstructive sleep apnea (adult) (pediatric): Secondary | ICD-10-CM | POA: Diagnosis not present

## 2022-12-08 ENCOUNTER — Telehealth: Payer: Medicare HMO | Admitting: Adult Health

## 2022-12-08 ENCOUNTER — Encounter: Payer: Self-pay | Admitting: Adult Health

## 2022-12-08 VITALS — Ht 74.0 in | Wt 270.0 lb

## 2022-12-08 DIAGNOSIS — G4733 Obstructive sleep apnea (adult) (pediatric): Secondary | ICD-10-CM | POA: Diagnosis not present

## 2022-12-08 NOTE — Progress Notes (Signed)
[facility] [address] [place]. [place] [address] [phone]       OFFICE FOLLOW UP NOTE  [name] Date of Birth:  06-09-1955 Medical Record Number:  [id number]   Reason for visit: Initial CPAP follow-up  Virtual Visit via Video Note  I connected with John Mitchell on 12/08/22 at  2:45 PM EDT by a video enabled telemedicine application and verified that I am speaking with the correct person using two identifiers.  Location: Patient: at home Provider: in office   I discussed the limitations of evaluation and management by telemedicine and the availability of in person appointments. The patient expressed understanding and agreed to proceed.    SUBJECTIVE:   Follow-up visit:  Prior visit:  Brief HPI:   John Mitchell is a 67 y.o. male who was evaluated by Dr. Frances Furbish on 05/11/2022 for concern of underlying sleep apnea with reports of snoring, excessive daytime somnolence and witnessed apneas.  ESS 8/24. FSS 24/63.  Completed sleep study 09/05/2022 which showed severe sleep apnea with total AHI of 60.3/h, REM AHI of 76.9/h, supine AHI of 76.4/h and O2 nadir of 77%.  AutoPap initiated 09/15/2022. DME [employer] [facility].   Interval history:  CPAP compliance report shows excellent usage although residual AHI at 11.6 and significantly elevated leaks. He does report significant improvement of daytime energy levels and sleep quality. Reports tolerating CPAP well, currently using FFM. He does have a mustache. Will wake up occasionally with leaks, will readjust mask and leak will resolve.  Prefers side sleeping, occasionally will wake up laying on his back.           ROS:   14 system review of systems performed and negative with exception of those listed in HPI  PMH:  Past Medical History:  Diagnosis Date   Acquired hypothyroidism    Allergic rhinitis    Anxiety    Peripheral vascular disease (HCC)    Primary osteoarthritis     Vasomotor rhinitis     PSH:  Past Surgical History:  Procedure Laterality Date   HAND SURGERY  09/2013   TONSILLECTOMY     VASECTOMY      Social History:  Social History   Socioeconomic History   Marital status: Married    Spouse name: Not on file   Number of children: Not on file   Years of education: Not on file   Highest education level: Not on file  Occupational History   Not on file  Tobacco Use   Smoking status: Former   Smokeless tobacco: Former    Types: Chew    Quit date: 08/11/2012  Substance and Sexual Activity   Alcohol use: No   Drug use: No   Sexual activity: Not on file  Other Topics Concern   Not on file  Social History Narrative   Not on file   Social Determinants of Health   Financial Resource Strain: Not on file  Food Insecurity: Not on file  Transportation Needs: Not on file  Physical Activity: Not on file  Stress: Not on file  Social Connections: Unknown (06/28/2021)   Received from [facility], [facility]   Social Network    Social Network: Not on file  Intimate Partner Violence: Unknown (05/20/2021)   Received from [facility], [facility]   HITS    Physically Hurt: Not on file    Insult or Talk Down To: Not on file    Threaten Physical Harm: Not on file  Scream or Curse: Not on file    Family History:  Family History  Problem Relation Age of Onset   Cancer Mother        Ovarian cancer   Heart attack Father    Dementia Father    Heart attack Brother    Asthma Son     Medications:   Current Outpatient Medications on File Prior to Visit  Medication Sig Dispense Refill   Cholecalciferol (VITAMIN D PO) Take by mouth.     citalopram (CELEXA) 40 MG tablet Take 40 mg by mouth daily.  0   ipratropium (ATROVENT) 0.06 % nasal spray Can use two sprays in each nostril up to four times a day as needed. 15 mL 5   levocetirizine (XYZAL) 5 MG tablet Take 5 mg by mouth every evening. (Patient not taking: Reported on 05/11/2022)      Loratadine (CLARITIN PO) Take 1 tablet by mouth as needed.     Multiple Vitamin (MULTIVITAMIN WITH MINERALS) TABS tablet Take 1 tablet by mouth daily.     No current facility-administered medications on file prior to visit.    Allergies:  No Known Allergies    OBJECTIVE: N/A d/t visit type   Vitals:   12/08/22 1457  Weight: 270 lb (122.5 kg)  Height: 6\' 2"  (1.88 m)   Body mass index is 34.67 kg/m. No results found.         ASSESSMENT/PLAN: John Mitchell is a 67 y.o. year old male    OSA on CPAP : Compliance report shows elevated residual AHI 11.6 suspect from high leak rate at 102L/min in the 93rd percentile. Order will be placed for mask refitting with DME. Will repeat download in 2 months for reevaluation.  Did discuss importance of weight loss.  Discussed continued nightly usage with ensuring greater than 4 hours nightly for optimal benefit and per insurance purposes.  Continue to follow with DME company for any needed supplies or CPAP related concerns     Follow up in 6 months via MyChart VV or call earlier if needed   CC:  PCP: Noberto Retort, MD    I spent 25 minutes of face-to-face and non-face-to-face time with patient via MyChart video visit.  This included previsit chart review, lab review, study review, order entry, electronic health record documentation, patient education and discussion regarding above diagnoses and treatment plan and answered all other questions to patient's satisfaction  Ihor Austin, [facility]  [facility] [address] [place], [place] [address]  Phone [phone] Fax [phone] Note: This document was prepared with digital dictation and possible smart phrase technology. Any transcriptional errors that result from this process are unintentional.

## 2022-12-12 ENCOUNTER — Encounter: Payer: Medicare HMO | Admitting: Adult Health

## 2022-12-12 ENCOUNTER — Telehealth: Payer: Self-pay | Admitting: Adult Health

## 2022-12-12 NOTE — Telephone Encounter (Signed)
thanks

## 2022-12-12 NOTE — Telephone Encounter (Signed)
Pt's wife called as a result of the initial cpap f/u being mised.  Pt has been rescheduled with Maralyn Sago, NP (since pt has not seen any NP before) wife informed pt must bring PAP and power cord, she is also aware of arrival time being 2:45pm for 3:15 appointment, this is FYI for POD 4, no call back requested

## 2022-12-13 ENCOUNTER — Ambulatory Visit: Payer: Medicare HMO | Admitting: Neurology

## 2022-12-16 DIAGNOSIS — G4733 Obstructive sleep apnea (adult) (pediatric): Secondary | ICD-10-CM | POA: Diagnosis not present

## 2022-12-23 DIAGNOSIS — G4733 Obstructive sleep apnea (adult) (pediatric): Secondary | ICD-10-CM | POA: Diagnosis not present

## 2022-12-26 DIAGNOSIS — G4733 Obstructive sleep apnea (adult) (pediatric): Secondary | ICD-10-CM | POA: Diagnosis not present

## 2023-01-15 DIAGNOSIS — G4733 Obstructive sleep apnea (adult) (pediatric): Secondary | ICD-10-CM | POA: Diagnosis not present

## 2023-01-17 DIAGNOSIS — H6501 Acute serous otitis media, right ear: Secondary | ICD-10-CM | POA: Diagnosis not present

## 2023-01-17 DIAGNOSIS — H6121 Impacted cerumen, right ear: Secondary | ICD-10-CM | POA: Diagnosis not present

## 2023-01-17 DIAGNOSIS — M62830 Muscle spasm of back: Secondary | ICD-10-CM | POA: Diagnosis not present

## 2023-01-22 DIAGNOSIS — G4733 Obstructive sleep apnea (adult) (pediatric): Secondary | ICD-10-CM | POA: Diagnosis not present

## 2023-01-31 ENCOUNTER — Encounter: Payer: Self-pay | Admitting: Adult Health

## 2023-02-15 DIAGNOSIS — G4733 Obstructive sleep apnea (adult) (pediatric): Secondary | ICD-10-CM | POA: Diagnosis not present

## 2023-02-28 DIAGNOSIS — J069 Acute upper respiratory infection, unspecified: Secondary | ICD-10-CM | POA: Diagnosis not present

## 2023-02-28 DIAGNOSIS — J101 Influenza due to other identified influenza virus with other respiratory manifestations: Secondary | ICD-10-CM | POA: Diagnosis not present

## 2023-03-18 DIAGNOSIS — G4733 Obstructive sleep apnea (adult) (pediatric): Secondary | ICD-10-CM | POA: Diagnosis not present

## 2023-04-05 DIAGNOSIS — R5383 Other fatigue: Secondary | ICD-10-CM | POA: Diagnosis not present

## 2023-04-05 DIAGNOSIS — R059 Cough, unspecified: Secondary | ICD-10-CM | POA: Diagnosis not present

## 2023-04-05 DIAGNOSIS — J208 Acute bronchitis due to other specified organisms: Secondary | ICD-10-CM | POA: Diagnosis not present

## 2023-04-05 DIAGNOSIS — J019 Acute sinusitis, unspecified: Secondary | ICD-10-CM | POA: Diagnosis not present

## 2023-04-05 DIAGNOSIS — B9689 Other specified bacterial agents as the cause of diseases classified elsewhere: Secondary | ICD-10-CM | POA: Diagnosis not present

## 2023-04-15 DIAGNOSIS — J329 Chronic sinusitis, unspecified: Secondary | ICD-10-CM | POA: Diagnosis not present

## 2023-04-15 DIAGNOSIS — R059 Cough, unspecified: Secondary | ICD-10-CM | POA: Diagnosis not present

## 2023-04-15 DIAGNOSIS — B9689 Other specified bacterial agents as the cause of diseases classified elsewhere: Secondary | ICD-10-CM | POA: Diagnosis not present

## 2023-04-15 DIAGNOSIS — G4733 Obstructive sleep apnea (adult) (pediatric): Secondary | ICD-10-CM | POA: Diagnosis not present

## 2023-04-20 DIAGNOSIS — J069 Acute upper respiratory infection, unspecified: Secondary | ICD-10-CM | POA: Diagnosis not present

## 2023-04-29 DIAGNOSIS — G4733 Obstructive sleep apnea (adult) (pediatric): Secondary | ICD-10-CM | POA: Diagnosis not present

## 2023-05-16 DIAGNOSIS — G4733 Obstructive sleep apnea (adult) (pediatric): Secondary | ICD-10-CM | POA: Diagnosis not present

## 2023-06-14 NOTE — Progress Notes (Signed)
 [facility] [address] [place]. [place] [address] [phone]       OFFICE FOLLOW UP NOTE  [name] Date of Birth:  07/14/1955 Medical Record Number:  [id number]    Primary neurologist: Dr. Omar Bibber Reason for visit: CPAP follow-up    SUBJECTIVE:   Follow-up visit:  Prior visit: 12/08/2022  Brief HPI:   John Mitchell is a 68 y.o. male who was evaluated by Dr. Omar Bibber on 05/11/2022 for concern of underlying sleep apnea with reports of snoring, excessive daytime somnolence and witnessed apneas.  ESS 8/24. FSS 24/63.  Completed sleep study 09/05/2022 which showed severe sleep apnea with total AHI of 60.3/h, REM AHI of 76.9/h, supine AHI of 76.4/h and O2 nadir of 77%.  AutoPap initiated 09/15/2022. DME [employer] [facility].  At prior visit, noted residual AHI 11.6/h suspected due to high leak.  Was advised to pursue mask refitting, use of nasal pillow.  On review of repeat CPAP download, noted improvement of residual AHI and leak.  No change in pressure setting required.     Interval history:  Returns today for CPAP compliance visit accompanied by his wife.  Compliance report as below showing excellent usage with residual AHI of 5.2.  Reports overall tolerating CPAP well.  Notes improvement of leaks since switching to fullface mask, previously using nasal pillows.  Tolerating fullface mask well.  No continued improvement in regards to daytime energy levels and sleep quality.  ESS 7/24.  Routinely follows with DME [employer] [facility].  Wife does mention occasional short-term memory difficulties, such as she will tell him something and then next day he asks the same question but will then remember conversation once wife reminds him. No other issues with memory, maintains ADLs and IADLs independently as well as driving.  His father did develop Alzheimer's dementia in his later 91s (shortly after his wife passed away).            ROS:   14 system review of  systems performed and negative with exception of those listed in HPI  PMH:  Past Medical History:  Diagnosis Date   Acquired hypothyroidism    Allergic rhinitis    Anxiety    Peripheral vascular disease (HCC)    Primary osteoarthritis    Vasomotor rhinitis     PSH:  Past Surgical History:  Procedure Laterality Date   HAND SURGERY  09/2013   TONSILLECTOMY     VASECTOMY      Social History:  Social History   Socioeconomic History   Marital status: Married    Spouse name: Not on file   Number of children: Not on file   Years of education: Not on file   Highest education level: Not on file  Occupational History   Not on file  Tobacco Use   Smoking status: Former   Smokeless tobacco: Former    Types: Chew    Quit date: 08/11/2012  Substance and Sexual Activity   Alcohol use: No   Drug use: No   Sexual activity: Not on file  Other Topics Concern   Not on file  Social History Narrative   Not on file   Social Drivers of Health   Financial Resource Strain: Not on file  Food Insecurity: Not on file  Transportation Needs: Not on file  Physical Activity: Not on file  Stress: Not on file  Social Connections: Unknown (06/28/2021)   [facility], [facility]   Social Network  Social Network: Not on file  Intimate Partner Violence: Unknown (05/20/2021)   [facility], [facility]   HITS    Physically Hurt: Not on file    Insult or Talk Down To: Not on file    Threaten Physical Harm: Not on file    Scream or Curse: Not on file    Family History:  Family History  Problem Relation Age of Onset   Cancer Mother        Ovarian cancer   Heart attack Father    Dementia Father    Heart attack Brother    Asthma Son     Medications:   Current Outpatient Medications on File Prior to Visit  Medication Sig Dispense Refill   aspirin  EC 81 MG tablet Take 81 mg by mouth daily. Swallow whole.     b complex vitamins capsule Take 1 capsule  by mouth daily.     Cholecalciferol (VITAMIN D PO) Take by mouth.     citalopram (CELEXA) 40 MG tablet Take 40 mg by mouth daily.  0   levocetirizine (XYZAL) 5 MG tablet Take 5 mg by mouth every evening.     Loratadine (CLARITIN PO) Take 1 tablet by mouth as needed.     Multiple Vitamin (MULTIVITAMIN WITH MINERALS) TABS tablet Take 1 tablet by mouth daily.     ipratropium (ATROVENT ) 0.06 % nasal spray Can use two sprays in each nostril up to four times a day as needed. (Patient not taking: Reported on 06/15/2023) 15 mL 5   No current facility-administered medications on file prior to visit.    Allergies:  No Known Allergies    OBJECTIVE: There were no vitals filed for this visit. There is no height or weight on file to calculate BMI.  General: well developed, well nourished, seated, in no evident distress  Neurologic Exam Mental Status: Awake and fully alert. Oriented to place and time. Recent and remote memory intact. Attention span, concentration and fund of knowledge appropriate. Mood and affect appropriate.  Cranial Nerves: Pupils equal, briskly reactive to light. Extraocular movements full without nystagmus. Visual fields full to confrontation. Hearing intact. Facial sensation intact. Face, tongue, palate moves normally and symmetrically.  Motor: Normal bulk and tone. Normal strength in all tested extremity muscles Gait and Station: Arises from chair without difficulty. Stance is normal. Gait demonstrates normal stride length and balance Reflexes: 1+ and symmetric. Toes downgoing.       ASSESSMENT/PLAN: John Mitchell is a 68 y.o. year old male    OSA on CPAP :  Compliance report shows excellent usage with residual AHI 5.2.  will keep current pressure settings Discussed continued nightly usage with ensuring greater than 4 hours nightly for optimal benefit and per insurance purposes.   Continue to follow with DME company for any needed supplies or CPAP related  concerns Sleep study 08/2022 showed overall severe OSA with a total AHI of 60.3/hour, REM AHI of 76.9/hour, supine AHI of 76.4/hour and O2 nadir of 77% (during supine REM sleep) and significant time below or at 88% saturation of 19 minutes, indicating nocturnal hypoxemia.   Mild short-term memory loss:   Suspect more due to age currently but due to family hx of AD, will continue to monitor Can follow up with PCP for lab work to assess for reversible causes such as vitamin or thyroid  deficiency  Hx of TIA Episode of amaurosis fugax 03/2022 Discussed importance of continue daily aspirin  81 mg daily for secondary stroke  Continue close follow-up with PCP for stroke risk factor management.  No prior history of HTN, HLD or DM.  Request routine monitoring by PCP     Follow-up in 1 year or call earlier if needed   CC:  PCP: Roselind Congo, MD    I spent 25 minutes of face-to-face and non-face-to-face time with patient and wife.  This included previsit chart review, lab review, study review, order entry, electronic health record documentation, patient education and discussion regarding above diagnoses and treatment plan and answered all other questions to patient's satisfaction  Johny Nap, [facility]  [facility] [address] [place], [place] [address]  Phone [phone] Fax [phone] Note: This document was prepared with digital dictation and possible smart phrase technology. Any transcriptional errors that result from this process are unintentional.

## 2023-06-15 ENCOUNTER — Telehealth: Payer: Medicare HMO | Admitting: Adult Health

## 2023-06-15 ENCOUNTER — Encounter: Payer: Self-pay | Admitting: Adult Health

## 2023-06-15 ENCOUNTER — Ambulatory Visit: Admitting: Adult Health

## 2023-06-15 VITALS — BP 143/89 | HR 78 | Ht 74.0 in | Wt 269.0 lb

## 2023-06-15 DIAGNOSIS — R413 Other amnesia: Secondary | ICD-10-CM | POA: Diagnosis not present

## 2023-06-15 DIAGNOSIS — G459 Transient cerebral ischemic attack, unspecified: Secondary | ICD-10-CM

## 2023-06-15 DIAGNOSIS — G4733 Obstructive sleep apnea (adult) (pediatric): Secondary | ICD-10-CM

## 2023-06-15 NOTE — Patient Instructions (Addendum)
 Your Plan:  Continue nightly use of CPAP with greater than 4 hours per night for optimal benefit and per insurance requirements  Continue to follow with your DME company [employer] for any needs supplies or CPAP related concerns      Follow-up in 1 year or call earlier if needed      Thank you for coming to see us  at [facility]. I hope we have been able to provide you high quality care today.  You may receive a patient satisfaction survey over the next few weeks. We would appreciate your feedback and comments so that we may continue to improve ourselves and the health of our patients.

## 2023-06-15 NOTE — Addendum Note (Signed)
 Addended by: Johny Nap L on: 06/15/2023 02:14 PM   Modules accepted: Orders

## 2023-07-03 DIAGNOSIS — Z125 Encounter for screening for malignant neoplasm of prostate: Secondary | ICD-10-CM | POA: Diagnosis not present

## 2023-07-03 DIAGNOSIS — F419 Anxiety disorder, unspecified: Secondary | ICD-10-CM | POA: Diagnosis not present

## 2023-07-03 DIAGNOSIS — G4733 Obstructive sleep apnea (adult) (pediatric): Secondary | ICD-10-CM | POA: Diagnosis not present

## 2023-07-03 DIAGNOSIS — E7800 Pure hypercholesterolemia, unspecified: Secondary | ICD-10-CM | POA: Diagnosis not present

## 2023-07-03 DIAGNOSIS — J301 Allergic rhinitis due to pollen: Secondary | ICD-10-CM | POA: Diagnosis not present

## 2023-07-03 DIAGNOSIS — Z1211 Encounter for screening for malignant neoplasm of colon: Secondary | ICD-10-CM | POA: Diagnosis not present

## 2023-08-10 DIAGNOSIS — L219 Seborrheic dermatitis, unspecified: Secondary | ICD-10-CM | POA: Diagnosis not present

## 2023-08-21 DIAGNOSIS — G4733 Obstructive sleep apnea (adult) (pediatric): Secondary | ICD-10-CM | POA: Diagnosis not present

## 2023-09-06 DIAGNOSIS — H5203 Hypermetropia, bilateral: Secondary | ICD-10-CM | POA: Diagnosis not present

## 2023-09-06 DIAGNOSIS — H35371 Puckering of macula, right eye: Secondary | ICD-10-CM | POA: Diagnosis not present

## 2023-09-14 DIAGNOSIS — E7800 Pure hypercholesterolemia, unspecified: Secondary | ICD-10-CM | POA: Diagnosis not present

## 2023-10-10 DIAGNOSIS — L820 Inflamed seborrheic keratosis: Secondary | ICD-10-CM | POA: Diagnosis not present

## 2023-10-10 DIAGNOSIS — L219 Seborrheic dermatitis, unspecified: Secondary | ICD-10-CM | POA: Diagnosis not present

## 2023-10-10 DIAGNOSIS — L821 Other seborrheic keratosis: Secondary | ICD-10-CM | POA: Diagnosis not present

## 2023-10-15 DIAGNOSIS — E7800 Pure hypercholesterolemia, unspecified: Secondary | ICD-10-CM | POA: Diagnosis not present

## 2023-11-14 DIAGNOSIS — E7800 Pure hypercholesterolemia, unspecified: Secondary | ICD-10-CM | POA: Diagnosis not present

## 2023-11-25 DIAGNOSIS — U071 COVID-19: Secondary | ICD-10-CM | POA: Diagnosis not present

## 2023-12-15 DIAGNOSIS — E7800 Pure hypercholesterolemia, unspecified: Secondary | ICD-10-CM | POA: Diagnosis not present

## 2024-01-12 DIAGNOSIS — G4733 Obstructive sleep apnea (adult) (pediatric): Secondary | ICD-10-CM | POA: Diagnosis not present

## 2024-01-14 DIAGNOSIS — E7800 Pure hypercholesterolemia, unspecified: Secondary | ICD-10-CM | POA: Diagnosis not present

## 2024-01-23 DIAGNOSIS — Z1211 Encounter for screening for malignant neoplasm of colon: Secondary | ICD-10-CM | POA: Diagnosis not present

## 2024-02-01 DIAGNOSIS — B356 Tinea cruris: Secondary | ICD-10-CM | POA: Diagnosis not present

## 2024-02-01 DIAGNOSIS — J302 Other seasonal allergic rhinitis: Secondary | ICD-10-CM | POA: Diagnosis not present

## 2024-03-08 ENCOUNTER — Encounter (INDEPENDENT_AMBULATORY_CARE_PROVIDER_SITE_OTHER): Payer: Self-pay

## 2024-04-18 ENCOUNTER — Institutional Professional Consult (permissible substitution) (INDEPENDENT_AMBULATORY_CARE_PROVIDER_SITE_OTHER)

## 2024-06-20 ENCOUNTER — Ambulatory Visit: Admitting: Adult Health
